# Patient Record
Sex: Male | Born: 1935 | Race: White | Hispanic: No | Marital: Married | Smoking: Former smoker
Health system: Southern US, Community
[De-identification: ages and names within clinical notes are randomized; demographics above are authoritative.]

## PROBLEM LIST (undated history)

## (undated) DIAGNOSIS — J45990 Exercise induced bronchospasm: Secondary | ICD-10-CM

## (undated) DIAGNOSIS — Z972 Presence of dental prosthetic device (complete) (partial): Secondary | ICD-10-CM

## (undated) DIAGNOSIS — H9193 Unspecified hearing loss, bilateral: Secondary | ICD-10-CM

## (undated) DIAGNOSIS — J45909 Unspecified asthma, uncomplicated: Secondary | ICD-10-CM

## (undated) DIAGNOSIS — N400 Enlarged prostate without lower urinary tract symptoms: Secondary | ICD-10-CM

## (undated) DIAGNOSIS — C801 Malignant (primary) neoplasm, unspecified: Secondary | ICD-10-CM

## (undated) DIAGNOSIS — Z85828 Personal history of other malignant neoplasm of skin: Secondary | ICD-10-CM

## (undated) DIAGNOSIS — K219 Gastro-esophageal reflux disease without esophagitis: Secondary | ICD-10-CM

## (undated) DIAGNOSIS — M1990 Unspecified osteoarthritis, unspecified site: Secondary | ICD-10-CM

## (undated) DIAGNOSIS — Z8669 Personal history of other diseases of the nervous system and sense organs: Secondary | ICD-10-CM

## (undated) HISTORY — PX: JOINT REPLACEMENT: SHX530

## (undated) HISTORY — PX: EYE SURGERY: SHX253

## (undated) HISTORY — PX: TONSILLECTOMY: SUR1361

## (undated) HISTORY — PX: CATARACT EXTRACTION W/ INTRAOCULAR LENS  IMPLANT, BILATERAL: SHX1307

---

## 1998-07-18 ENCOUNTER — Ambulatory Visit (HOSPITAL_COMMUNITY): Admission: RE | Admit: 1998-07-18 | Discharge: 1998-07-18 | Payer: Self-pay | Admitting: Family Medicine

## 2000-11-21 ENCOUNTER — Encounter: Payer: Self-pay | Admitting: Family Medicine

## 2000-11-21 ENCOUNTER — Ambulatory Visit (HOSPITAL_COMMUNITY): Admission: RE | Admit: 2000-11-21 | Discharge: 2000-11-21 | Payer: Self-pay | Admitting: Family Medicine

## 2000-11-28 ENCOUNTER — Encounter: Payer: Self-pay | Admitting: Family Medicine

## 2000-11-28 ENCOUNTER — Encounter: Admission: RE | Admit: 2000-11-28 | Discharge: 2000-11-28 | Payer: Self-pay | Admitting: Family Medicine

## 2000-12-27 HISTORY — PX: TOTAL KNEE ARTHROPLASTY: SHX125

## 2001-02-02 ENCOUNTER — Encounter: Admission: RE | Admit: 2001-02-02 | Discharge: 2001-04-17 | Payer: Self-pay | Admitting: Orthopaedic Surgery

## 2001-03-22 ENCOUNTER — Encounter: Payer: Self-pay | Admitting: Family Medicine

## 2001-03-22 ENCOUNTER — Ambulatory Visit (HOSPITAL_COMMUNITY): Admission: RE | Admit: 2001-03-22 | Discharge: 2001-03-22 | Payer: Self-pay | Admitting: Family Medicine

## 2001-05-15 ENCOUNTER — Ambulatory Visit (HOSPITAL_COMMUNITY): Admission: RE | Admit: 2001-05-15 | Discharge: 2001-05-15 | Payer: Self-pay | Admitting: Family Medicine

## 2001-05-18 ENCOUNTER — Inpatient Hospital Stay (HOSPITAL_COMMUNITY): Admission: RE | Admit: 2001-05-18 | Discharge: 2001-05-22 | Payer: Self-pay | Admitting: Orthopaedic Surgery

## 2001-06-27 ENCOUNTER — Encounter: Admission: RE | Admit: 2001-06-27 | Discharge: 2001-07-27 | Payer: Self-pay | Admitting: Orthopaedic Surgery

## 2001-08-21 ENCOUNTER — Encounter: Admission: RE | Admit: 2001-08-21 | Discharge: 2001-11-19 | Payer: Self-pay | Admitting: Orthopaedic Surgery

## 2001-11-20 ENCOUNTER — Encounter: Admission: RE | Admit: 2001-11-20 | Discharge: 2002-02-18 | Payer: Self-pay | Admitting: Orthopaedic Surgery

## 2002-01-25 ENCOUNTER — Ambulatory Visit (HOSPITAL_COMMUNITY): Admission: RE | Admit: 2002-01-25 | Discharge: 2002-01-25 | Payer: Self-pay | Admitting: Gastroenterology

## 2002-02-19 ENCOUNTER — Encounter: Admission: RE | Admit: 2002-02-19 | Discharge: 2002-05-20 | Payer: Self-pay | Admitting: Orthopaedic Surgery

## 2002-05-21 ENCOUNTER — Encounter: Admission: RE | Admit: 2002-05-21 | Discharge: 2002-05-25 | Payer: Self-pay | Admitting: Orthopaedic Surgery

## 2002-12-27 HISTORY — PX: ROTATOR CUFF REPAIR: SHX139

## 2004-05-12 ENCOUNTER — Ambulatory Visit (HOSPITAL_COMMUNITY): Admission: RE | Admit: 2004-05-12 | Discharge: 2004-05-12 | Payer: Self-pay | Admitting: Gastroenterology

## 2005-09-02 ENCOUNTER — Encounter: Admission: RE | Admit: 2005-09-02 | Discharge: 2005-09-02 | Payer: Self-pay | Admitting: Orthopaedic Surgery

## 2005-09-04 ENCOUNTER — Encounter: Admission: RE | Admit: 2005-09-04 | Discharge: 2005-09-04 | Payer: Self-pay | Admitting: Orthopaedic Surgery

## 2005-10-19 ENCOUNTER — Encounter: Admission: RE | Admit: 2005-10-19 | Discharge: 2005-10-19 | Payer: Self-pay | Admitting: Orthopaedic Surgery

## 2005-10-21 ENCOUNTER — Ambulatory Visit (HOSPITAL_BASED_OUTPATIENT_CLINIC_OR_DEPARTMENT_OTHER): Admission: RE | Admit: 2005-10-21 | Discharge: 2005-10-22 | Payer: Self-pay | Admitting: Orthopaedic Surgery

## 2005-10-21 ENCOUNTER — Ambulatory Visit (HOSPITAL_COMMUNITY): Admission: RE | Admit: 2005-10-21 | Discharge: 2005-10-21 | Payer: Self-pay | Admitting: Orthopaedic Surgery

## 2005-11-02 ENCOUNTER — Encounter: Admission: RE | Admit: 2005-11-02 | Discharge: 2005-11-30 | Payer: Self-pay | Admitting: Orthopaedic Surgery

## 2005-11-13 IMAGING — CR DG CHEST 2V
2 series · 2 of 2 positions shown · non-contrast
Comparison: none

CLINICAL DATA: Preop respiratory exam for impingement and rotator cuff tear.
 CHEST X-RAY:
 Two views of the chest show the lungs to be clear.  Mild peribronchial thickening is noted.  The heart is within upper limits of normal.  Old left rib fracture is present.

[w chest pa]
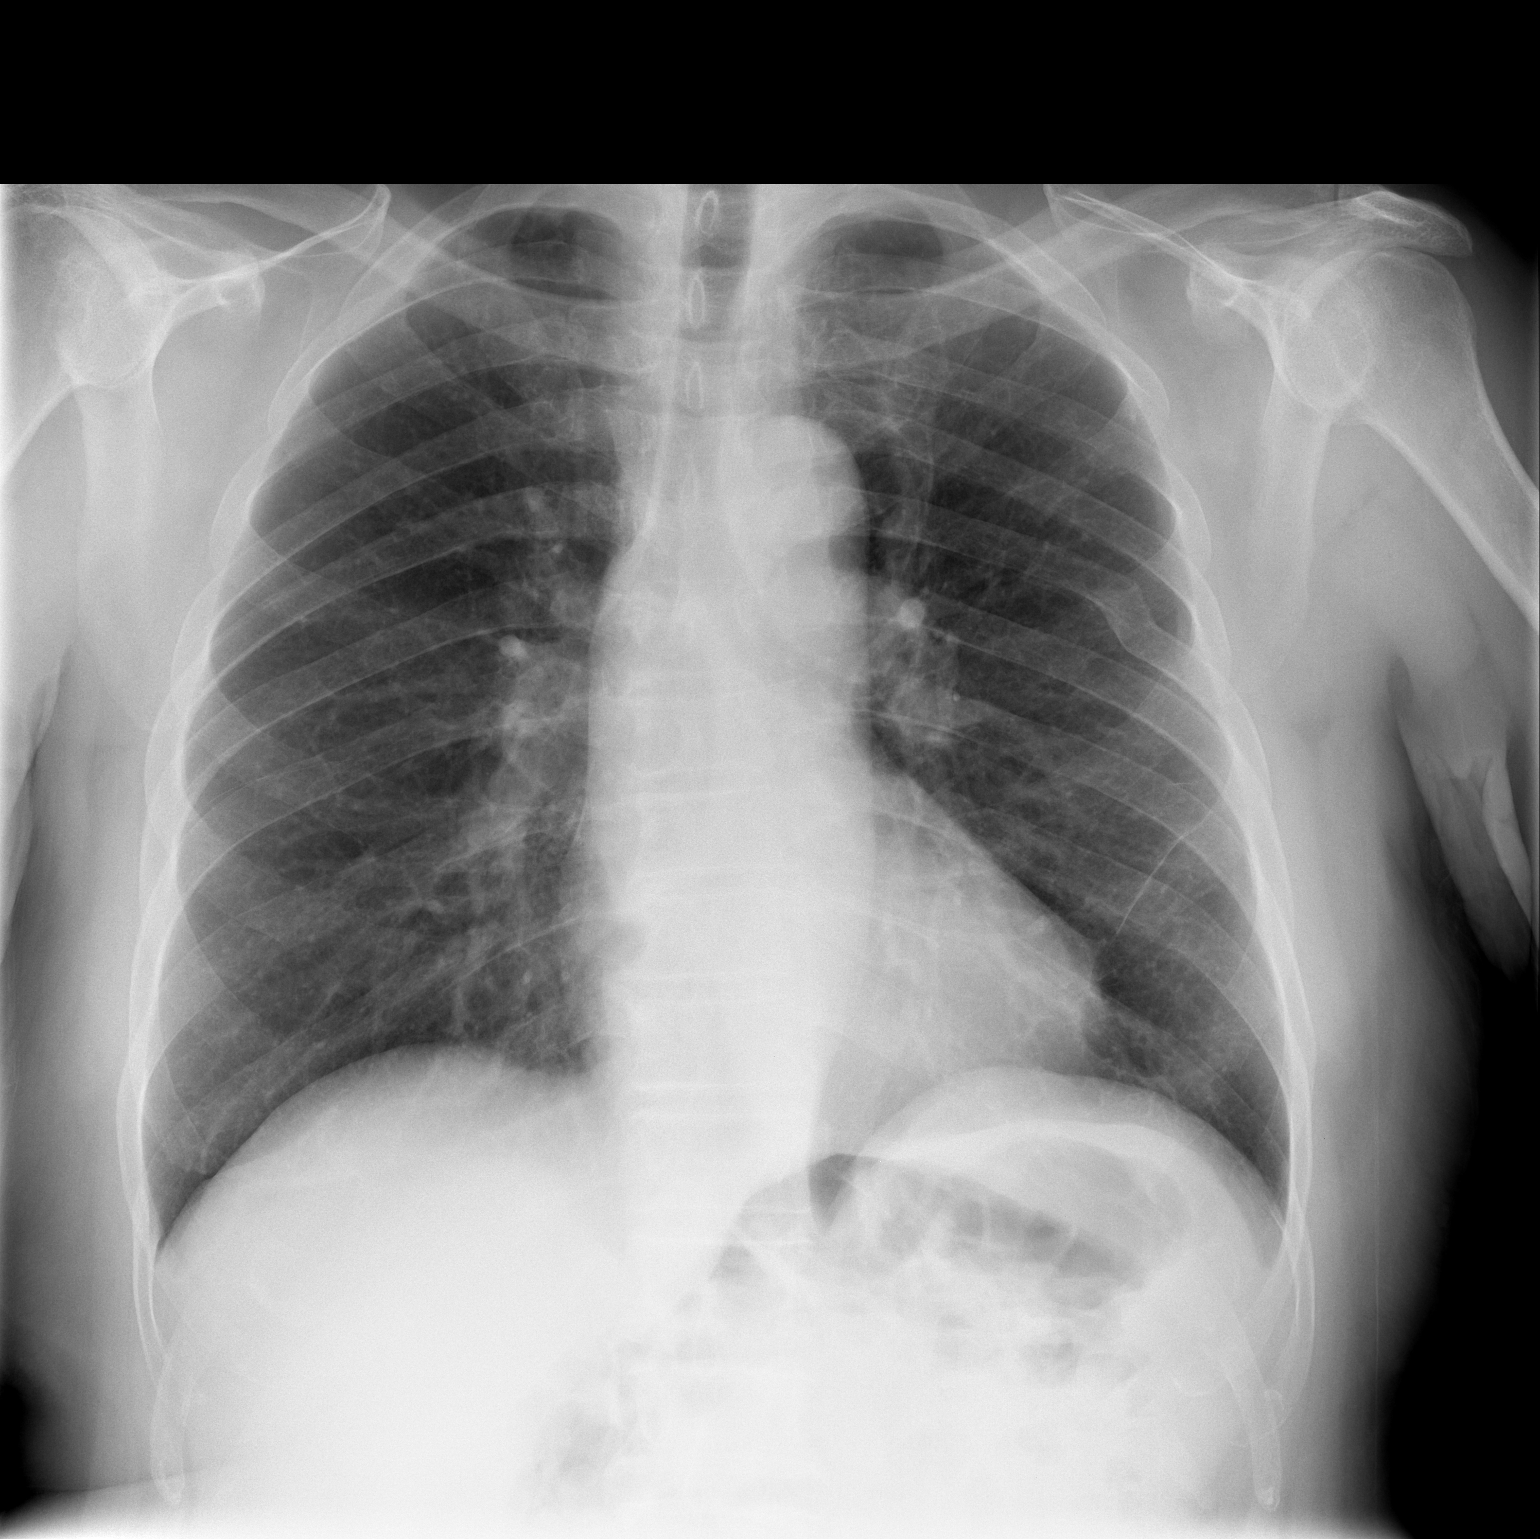

[w chest lat]
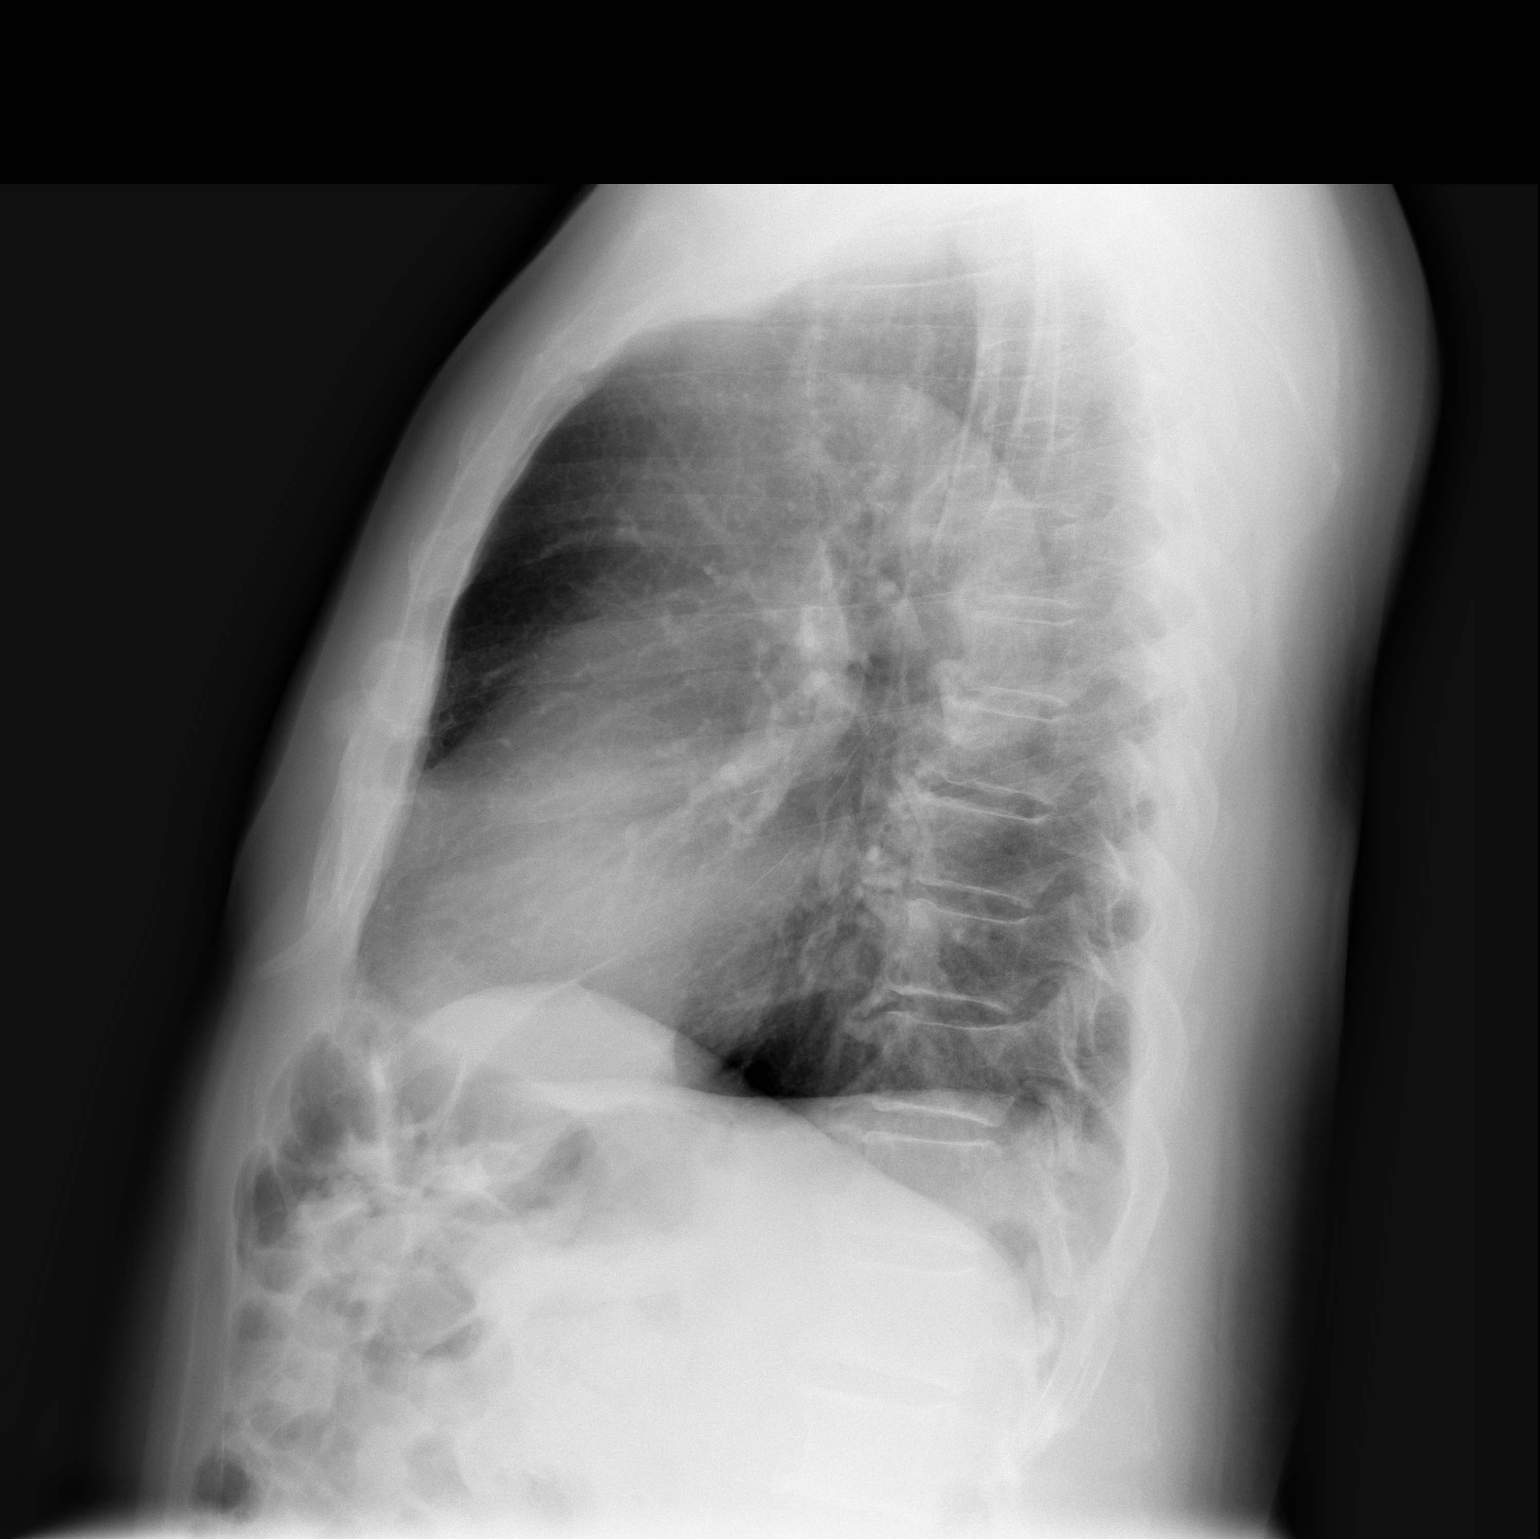

[2 of 2 positions shown; findings below may reference images not displayed]

IMPRESSION: No active lung disease.

## 2005-12-01 ENCOUNTER — Encounter: Admission: RE | Admit: 2005-12-01 | Discharge: 2005-12-30 | Payer: Self-pay | Admitting: Orthopaedic Surgery

## 2005-12-31 ENCOUNTER — Encounter: Admission: RE | Admit: 2005-12-31 | Discharge: 2006-03-31 | Payer: Self-pay | Admitting: Orthopaedic Surgery

## 2010-12-27 HISTORY — PX: COLONOSCOPY W/ POLYPECTOMY: SHX1380

## 2012-01-12 ENCOUNTER — Other Ambulatory Visit: Payer: Self-pay | Admitting: Dermatology

## 2012-01-12 DIAGNOSIS — D216 Benign neoplasm of connective and other soft tissue of trunk, unspecified: Secondary | ICD-10-CM | POA: Diagnosis not present

## 2012-01-12 DIAGNOSIS — L821 Other seborrheic keratosis: Secondary | ICD-10-CM | POA: Diagnosis not present

## 2012-01-12 DIAGNOSIS — D235 Other benign neoplasm of skin of trunk: Secondary | ICD-10-CM | POA: Diagnosis not present

## 2012-01-12 DIAGNOSIS — Z85828 Personal history of other malignant neoplasm of skin: Secondary | ICD-10-CM | POA: Diagnosis not present

## 2012-02-28 DIAGNOSIS — M216X9 Other acquired deformities of unspecified foot: Secondary | ICD-10-CM | POA: Diagnosis not present

## 2012-03-17 DIAGNOSIS — L600 Ingrowing nail: Secondary | ICD-10-CM | POA: Diagnosis not present

## 2012-06-07 DIAGNOSIS — H532 Diplopia: Secondary | ICD-10-CM | POA: Diagnosis not present

## 2012-06-07 DIAGNOSIS — Z961 Presence of intraocular lens: Secondary | ICD-10-CM | POA: Diagnosis not present

## 2012-06-07 DIAGNOSIS — H5000 Unspecified esotropia: Secondary | ICD-10-CM | POA: Diagnosis not present

## 2012-06-07 DIAGNOSIS — H52209 Unspecified astigmatism, unspecified eye: Secondary | ICD-10-CM | POA: Diagnosis not present

## 2012-09-15 DIAGNOSIS — Z23 Encounter for immunization: Secondary | ICD-10-CM | POA: Diagnosis not present

## 2013-01-11 DIAGNOSIS — D1739 Benign lipomatous neoplasm of skin and subcutaneous tissue of other sites: Secondary | ICD-10-CM | POA: Diagnosis not present

## 2013-01-11 DIAGNOSIS — L821 Other seborrheic keratosis: Secondary | ICD-10-CM | POA: Diagnosis not present

## 2013-01-11 DIAGNOSIS — L219 Seborrheic dermatitis, unspecified: Secondary | ICD-10-CM | POA: Diagnosis not present

## 2013-01-11 DIAGNOSIS — L570 Actinic keratosis: Secondary | ICD-10-CM | POA: Diagnosis not present

## 2013-04-11 DIAGNOSIS — R5381 Other malaise: Secondary | ICD-10-CM | POA: Diagnosis not present

## 2013-04-11 DIAGNOSIS — M1990 Unspecified osteoarthritis, unspecified site: Secondary | ICD-10-CM | POA: Diagnosis not present

## 2013-04-11 DIAGNOSIS — K219 Gastro-esophageal reflux disease without esophagitis: Secondary | ICD-10-CM | POA: Diagnosis not present

## 2013-04-11 DIAGNOSIS — N400 Enlarged prostate without lower urinary tract symptoms: Secondary | ICD-10-CM | POA: Diagnosis not present

## 2013-04-27 DIAGNOSIS — N3941 Urge incontinence: Secondary | ICD-10-CM | POA: Diagnosis not present

## 2013-04-27 DIAGNOSIS — N401 Enlarged prostate with lower urinary tract symptoms: Secondary | ICD-10-CM | POA: Diagnosis not present

## 2013-06-11 DIAGNOSIS — H524 Presbyopia: Secondary | ICD-10-CM | POA: Diagnosis not present

## 2013-06-11 DIAGNOSIS — H52209 Unspecified astigmatism, unspecified eye: Secondary | ICD-10-CM | POA: Diagnosis not present

## 2013-06-11 DIAGNOSIS — H532 Diplopia: Secondary | ICD-10-CM | POA: Diagnosis not present

## 2013-06-11 DIAGNOSIS — Z961 Presence of intraocular lens: Secondary | ICD-10-CM | POA: Diagnosis not present

## 2013-11-01 DIAGNOSIS — Z23 Encounter for immunization: Secondary | ICD-10-CM | POA: Diagnosis not present

## 2013-11-27 DIAGNOSIS — K219 Gastro-esophageal reflux disease without esophagitis: Secondary | ICD-10-CM | POA: Diagnosis not present

## 2013-11-27 DIAGNOSIS — R141 Gas pain: Secondary | ICD-10-CM | POA: Diagnosis not present

## 2014-01-10 DIAGNOSIS — M169 Osteoarthritis of hip, unspecified: Secondary | ICD-10-CM | POA: Diagnosis not present

## 2014-01-10 DIAGNOSIS — Z85828 Personal history of other malignant neoplasm of skin: Secondary | ICD-10-CM | POA: Diagnosis not present

## 2014-01-10 DIAGNOSIS — L821 Other seborrheic keratosis: Secondary | ICD-10-CM | POA: Diagnosis not present

## 2014-01-10 DIAGNOSIS — M1710 Unilateral primary osteoarthritis, unspecified knee: Secondary | ICD-10-CM | POA: Diagnosis not present

## 2014-01-10 DIAGNOSIS — M1610 Unilateral primary osteoarthritis, unspecified hip: Secondary | ICD-10-CM | POA: Diagnosis not present

## 2014-01-10 DIAGNOSIS — L280 Lichen simplex chronicus: Secondary | ICD-10-CM | POA: Diagnosis not present

## 2014-01-16 DIAGNOSIS — M1610 Unilateral primary osteoarthritis, unspecified hip: Secondary | ICD-10-CM | POA: Diagnosis not present

## 2014-01-16 DIAGNOSIS — M25559 Pain in unspecified hip: Secondary | ICD-10-CM | POA: Diagnosis not present

## 2014-04-08 DIAGNOSIS — H6500 Acute serous otitis media, unspecified ear: Secondary | ICD-10-CM | POA: Diagnosis not present

## 2014-04-08 DIAGNOSIS — H908 Mixed conductive and sensorineural hearing loss, unspecified: Secondary | ICD-10-CM | POA: Diagnosis not present

## 2014-04-08 DIAGNOSIS — H9190 Unspecified hearing loss, unspecified ear: Secondary | ICD-10-CM | POA: Diagnosis not present

## 2014-04-08 DIAGNOSIS — H6120 Impacted cerumen, unspecified ear: Secondary | ICD-10-CM | POA: Diagnosis not present

## 2014-04-29 DIAGNOSIS — J309 Allergic rhinitis, unspecified: Secondary | ICD-10-CM | POA: Diagnosis not present

## 2014-09-09 DIAGNOSIS — Z86010 Personal history of colonic polyps: Secondary | ICD-10-CM | POA: Diagnosis not present

## 2014-09-09 DIAGNOSIS — R198 Other specified symptoms and signs involving the digestive system and abdomen: Secondary | ICD-10-CM | POA: Diagnosis not present

## 2014-10-07 DIAGNOSIS — M25552 Pain in left hip: Secondary | ICD-10-CM | POA: Diagnosis not present

## 2014-10-07 DIAGNOSIS — M1612 Unilateral primary osteoarthritis, left hip: Secondary | ICD-10-CM | POA: Diagnosis not present

## 2014-10-07 DIAGNOSIS — M25511 Pain in right shoulder: Secondary | ICD-10-CM | POA: Diagnosis not present

## 2014-10-07 DIAGNOSIS — M75121 Complete rotator cuff tear or rupture of right shoulder, not specified as traumatic: Secondary | ICD-10-CM | POA: Diagnosis not present

## 2014-10-09 DIAGNOSIS — L821 Other seborrheic keratosis: Secondary | ICD-10-CM | POA: Diagnosis not present

## 2014-10-09 DIAGNOSIS — Z85828 Personal history of other malignant neoplasm of skin: Secondary | ICD-10-CM | POA: Diagnosis not present

## 2014-10-09 DIAGNOSIS — M25552 Pain in left hip: Secondary | ICD-10-CM | POA: Diagnosis not present

## 2014-10-09 DIAGNOSIS — M169 Osteoarthritis of hip, unspecified: Secondary | ICD-10-CM | POA: Diagnosis not present

## 2014-10-16 DIAGNOSIS — Z86010 Personal history of colonic polyps: Secondary | ICD-10-CM | POA: Diagnosis not present

## 2014-11-07 DIAGNOSIS — M1612 Unilateral primary osteoarthritis, left hip: Secondary | ICD-10-CM | POA: Diagnosis not present

## 2014-11-07 DIAGNOSIS — M25511 Pain in right shoulder: Secondary | ICD-10-CM | POA: Diagnosis not present

## 2014-11-07 DIAGNOSIS — M25552 Pain in left hip: Secondary | ICD-10-CM | POA: Diagnosis not present

## 2014-11-07 DIAGNOSIS — M7551 Bursitis of right shoulder: Secondary | ICD-10-CM | POA: Diagnosis not present

## 2014-11-25 ENCOUNTER — Ambulatory Visit: Payer: Medicare Other | Attending: Orthopaedic Surgery | Admitting: Physical Therapy

## 2014-11-25 DIAGNOSIS — Z5189 Encounter for other specified aftercare: Secondary | ICD-10-CM | POA: Diagnosis not present

## 2014-11-25 DIAGNOSIS — Z96651 Presence of right artificial knee joint: Secondary | ICD-10-CM | POA: Insufficient documentation

## 2014-11-25 DIAGNOSIS — Z01818 Encounter for other preprocedural examination: Secondary | ICD-10-CM | POA: Insufficient documentation

## 2014-11-25 DIAGNOSIS — R531 Weakness: Secondary | ICD-10-CM | POA: Diagnosis not present

## 2014-11-25 DIAGNOSIS — M1612 Unilateral primary osteoarthritis, left hip: Secondary | ICD-10-CM | POA: Insufficient documentation

## 2014-11-25 DIAGNOSIS — R29898 Other symptoms and signs involving the musculoskeletal system: Secondary | ICD-10-CM
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
## 2014-11-25 NOTE — Patient Instructions (Signed)
Hip Flexion / Knee Extension: Straight-Leg Raise (Eccentric)   Lie on back. Lift leg with knee straight. Slowly lower leg for 3-5 seconds. _10__ reps per set, _1-2__ sets per day.   ABDUCTION: Side-Lying (Active)   Lie on left side, top leg straight. Raise top leg as far as possible.  Complete _1__ sets of _10__ repetitions. Perform _1-2_ sessions per day.  http://gtsc.exer.us/94   (Home) Extension: Hip   With support under abdomen, tighten stomach. Lift right leg in line with body. Do not hyperextend. Alternate legs. Repeat _10___ times per set. Do _1___ sets per session. Do _1-2___ sessions per day. ADDUCTION: Side-Lying (Active)   Lie on right side, with top leg bent and in front of other leg. Lift straight leg up as high as possible.  Complete __1_ sets of _10__ repetitions. Perform _1-2__ sessions per day.  http://gtsc.exer.us/129   Copyright  VHI. All rights reserved.  Laureen Abrahams, PT, DPT 11/25/2014 10:39 AM [address] [phone] (office) [phone] (fax)

## 2014-12-02 ENCOUNTER — Ambulatory Visit: Payer: Medicare Other | Attending: Orthopaedic Surgery | Admitting: Physical Therapy

## 2014-12-02 DIAGNOSIS — R531 Weakness: Secondary | ICD-10-CM | POA: Insufficient documentation

## 2014-12-02 DIAGNOSIS — Z01818 Encounter for other preprocedural examination: Secondary | ICD-10-CM | POA: Diagnosis not present

## 2014-12-02 DIAGNOSIS — R29898 Other symptoms and signs involving the musculoskeletal system: Secondary | ICD-10-CM

## 2014-12-02 DIAGNOSIS — Z5189 Encounter for other specified aftercare: Secondary | ICD-10-CM | POA: Insufficient documentation

## 2014-12-02 DIAGNOSIS — M1612 Unilateral primary osteoarthritis, left hip: Secondary | ICD-10-CM | POA: Diagnosis not present

## 2014-12-02 DIAGNOSIS — Z96651 Presence of right artificial knee joint: Secondary | ICD-10-CM | POA: Insufficient documentation

## 2014-12-02 NOTE — Therapy (Signed)
Outpatient Rehabilitation [facility] [address] [place], [place], [address] Phone: [phone]   Fax:  [phone]  Physical Therapy Treatment  Patient Details  Name: Aaron Alvarez MRN: [id number] Date of Birth: 08/11/1925  Encounter Date: 12/02/2014      PT End of Session - 12/02/14 1455    Visit Number 2   Number of Visits 4   Date for PT Re-Evaluation 01/24/15   PT Start Time 3299   PT Stop Time 1455   PT Time Calculation (min) 40 min   Activity Tolerance Patient tolerated treatment well   Behavior During Therapy [facility] for tasks assessed/performed      No past medical history on file.  No past surgical history on file.  There were no vitals taken for this visit.  Visit Diagnosis:  Arthritis of left hip  Weakness of left hip      Subjective Assessment - 12/02/14 1419    Symptoms Pt reports L hip soreness; took a twisting step which increased pain/inflammation (last Thursday) and continues to have pain/   Pertinent History L TKA 2003   Limitations Walking;Sitting   How long can you walk comfortably? 1 mile   Patient Stated Goals improve strength in hip   Currently in Pain? Yes   Pain Score 4    Pain Location Hip   Pain Orientation Left   Pain Descriptors / Indicators Aching   Pain Type Chronic pain   Pain Onset More than a month ago   Pain Frequency Intermittent   Aggravating Factors  walking without cane   Pain Relieving Factors rest   Multiple Pain Sites No            [facility] Adult PT Treatment/Exercise - 12/02/14 1420    Exercises   Exercises Knee/Hip   Knee/Hip Exercises: Aerobic   Stationary Bike NuStep x 8 min, Level 3, 4 extremities   Knee/Hip Exercises: Standing   Gait Training marching forwards/backwards with 1UE support   Knee/Hip Exercises: Supine   Bridges 10 reps;Both   Straight Leg Raises Strengthening;Both;10 reps   Knee/Hip Exercises: Sidelying   Hip ABduction Strengthening;Both;10 reps   Hip ADduction  Strengthening;Both;10 reps   Clams x 10 reps bil with red theraband-mod cues for technique   Knee/Hip Exercises: Prone   Hip Extension Strengthening;Both;10 reps          PT Education - 12/02/14 1454    Education provided Yes   Education Details HEP reviewed   Person(s) Educated Patient   Methods Explanation;Demonstration;Tactile cues;Verbal cues   Comprehension Verbalized understanding;Returned demonstration            PT Long Term Goals - 12/02/14 1455    PT LONG TERM GOAL #1   Title independent with advanced HEP to improve strength prior to THA. (12/23/14)   Time 4   Period Weeks   Status On-going   PT LONG TERM GOAL #2   Title report pain < 3/10 with activity (12/23/14)   Time 4   Period Weeks   Status On-going          Plan - 12/02/14 1455    Clinical Impression Statement Pt reports continued L hip pain but compliance with HEP and feels exercises are helping.   PT Next Visit Plan update HEP to standing exercises, clams and bridging   Consulted and Agree with Plan of Care Patient  Problem List There are no active problems to display for this patient.  Laureen Abrahams, PT, DPT 12/02/2014 2:56 PM  [facility] [address], [place] [address]  [phone] (office) [phone] (fax)

## 2014-12-09 ENCOUNTER — Ambulatory Visit: Payer: Medicare Other | Admitting: Physical Therapy

## 2014-12-09 DIAGNOSIS — M1612 Unilateral primary osteoarthritis, left hip: Secondary | ICD-10-CM | POA: Diagnosis not present

## 2014-12-09 DIAGNOSIS — R29898 Other symptoms and signs involving the musculoskeletal system: Secondary | ICD-10-CM

## 2014-12-09 DIAGNOSIS — H8111 Benign paroxysmal vertigo, right ear: Secondary | ICD-10-CM

## 2014-12-09 NOTE — Patient Instructions (Signed)
Standing Marching   Using a chair if necessary, march in place. Repeat __10__ times. Do _1-2___ sessions per day.  http://gt2.exer.us/344   Copyright  VHI. All rights reserved.   Hip Backward Kick   Using a chair for balance, keep legs shoulder width apart and toes pointed for- ward. Slowly extend one leg back, keeping knee straight. Do not lean forward. Repeat with other leg. Repeat _10___ times. Do _1-2___ sessions per day.  http://gt2.exer.us/340   Copyright  VHI. All rights reserved.   Hip Side Kick   Holding a chair for balance, keep legs shoulder width apart and toes pointed forward. Swing a leg out to side, keeping knee straight. Do not lean. Repeat using other leg. Repeat _10___ times. Do _1-2___ sessions per day.  http://gt2.exer.us/342   Copyright  VHI. All rights reserved.    Heel Raise: Bilateral (Standing)   Rise on balls of feet. Repeat __20__ times per set. Do _1___ sets per session. Do __1-2__ sessions per day.  http://orth.exer.us/38   Copyright  VHI. All rights reserved.   Laureen Abrahams, PT, DPT 12/09/2014 10:51 AM  [facility] [address]  [address]  [phone] (office) [phone] (fax)

## 2014-12-09 NOTE — Therapy (Signed)
Outpatient Rehabilitation [facility] [address] [place], [place], [address] Phone: [phone]   Fax:  [phone]  Physical Therapy Treatment  Patient Details  Name: Aaron Alvarez MRN: [id number] Date of Birth: 25-Aug-1925  Encounter Date: 12/09/2014      PT End of Session - 12/09/14 1102    Visit Number 3   Number of Visits 4   Date for PT Re-Evaluation 01/24/15   PT Start Time 1015   PT Stop Time 1057   PT Time Calculation (min) 42 min   Activity Tolerance Patient tolerated treatment well   Behavior During Therapy [facility] for tasks assessed/performed      No past medical history on file.  No past surgical history on file.  There were no vitals taken for this visit.  Visit Diagnosis:  Arthritis of left hip - Plan: PT plan of care cert/re-cert  Weakness of left hip - Plan: PT plan of care cert/re-cert  BPPV (benign paroxysmal positional vertigo), right - Plan: PT plan of care cert/re-cert      Subjective Assessment - 12/09/14 1041    Symptoms Episodes of vertigo this weekend.  Hasn't been able to do exercises over weekend except 1x/day   Pertinent History L TKA 2003   Limitations Walking;Sitting   How long can you walk comfortably? 1 mile   Patient Stated Goals improve strength in hip   Currently in Pain? Yes   Pain Score 1    Pain Location Hip   Pain Orientation Left   Pain Descriptors / Indicators Aching   Pain Type Chronic pain   Pain Onset More than a month ago   Pain Frequency Intermittent   Aggravating Factors  walking without cane   Pain Relieving Factors rest   Multiple Pain Sites No            [facility] Adult PT Treatment/Exercise - 12/09/14 1044    Lumbar Exercises: Standing   Heel Raises 20 reps;2 seconds   Knee/Hip Exercises: Standing   Gait Training standing hip marching, abdct, ext x 10 reps bil          PT Education - 12/09/14 1101    Education provided Yes   Education Details standing HEP; BPPV   Person(s) Educated  Patient   Methods Explanation;Demonstration;Handout   Comprehension Verbalized understanding;Returned demonstration            PT Long Term Goals - 12/09/14 1105    PT LONG TERM GOAL #1   Title independent with advanced HEP to improve strength prior to THA. (12/23/14)   Time 4   Period Weeks   Status On-going   PT LONG TERM GOAL #2   Title report pain < 3/10 with activity (12/23/14)   Time 4   Period Weeks   Status On-going          Plan - 12/09/14 1102    Clinical Impression Statement Pt presents today with 3 day hx of vertigo.  R sidelying test revealed upbeating rotary nystagmus ~10 sec duration.  Treated with R epley's x 2 reps with resolve of symptoms.  Pt unable to perform HEP consistently due to vertigo.  Pt has MD appt tomorrow to follow up with vertigo.   Pt will benefit from skilled therapeutic intervention in order to improve on the following deficits Abnormal gait;Decreased activity tolerance;Decreased balance;Decreased mobility;Decreased strength;Pain;Decreased range of motion;Difficulty walking;Impaired flexibility;Other (comment)  vertigo   Rehab Potential Good   PT Frequency 1x / week   PT Duration 4 weeks  PT Treatment/Interventions ADLs/Self Care Home Management;Therapeutic activities;Patient/family education;Therapeutic exercise;Passive range of motion;Gait training;Neuromuscular re-education  canalith repositioning   PT Next Visit Plan review standing HEP, add additional exercises as needed; plan to begin checking goals   Consulted and Agree with Plan of Care Patient               Vestibular Assessment - 12/09/14 1043    Sidelying Test Sidelying Right;Sidelying Left   Sidelying Right Duration 10 sec   Sidelying Right Symptoms Upbeat, right rotatory nystagmus   Sidelying Left Duration negative   Sidelying Left Symptoms No nystagmus         Vestibular Treatment/Exercise - 12/09/14 1042    Vestibular Treatment Provided Canalith Repositioning    Canalith Repositioning Epley Manuever Right   Number of Reps  2   Overall Response Symptoms Resolved                       Problem List There are no active problems to display for this patient.  Laureen Abrahams, PT, DPT 12/09/2014 11:12 AM  [facility] [address], [place] [address]  [phone] (office) [phone] (fax)

## 2014-12-16 ENCOUNTER — Ambulatory Visit: Payer: Medicare Other | Admitting: Physical Therapy

## 2014-12-16 DIAGNOSIS — M1612 Unilateral primary osteoarthritis, left hip: Secondary | ICD-10-CM

## 2014-12-16 DIAGNOSIS — R29898 Other symptoms and signs involving the musculoskeletal system: Secondary | ICD-10-CM

## 2014-12-16 NOTE — Patient Instructions (Signed)
Wrap band around ankles.  At countertop you can sidestep; walk forwards; or walk backwards.  Hold on for support.  Perform 1-3 times a day; 2-3 reps per session.  Laureen Abrahams, PT, DPT 12/16/2014 10:42 AM   Outpatient Rehab [address], [place] [address]  [phone] (office) [phone] (fax)

## 2014-12-16 NOTE — Therapy (Signed)
[place], [place], [address] Phone: [phone]   Fax:  [phone]  Physical Therapy Treatment  Patient Details  Name: Aaron Alvarez MRN: [id number] Date of Birth: 1925-12-16  Encounter Date: Jan 09, 2015      PT End of Session - 01-09-15 1115    Visit Number 4   Number of Visits 4   Date for PT Re-Evaluation 01/24/15   PT Start Time 4270   PT Stop Time 1045   PT Time Calculation (min) 30 min   Activity Tolerance Patient tolerated treatment well   Behavior During Therapy [facility] for tasks assessed/performed      No past medical history on file.  No past surgical history on file.  There were no vitals taken for this visit.  Visit Diagnosis:  Arthritis of left hip  Weakness of left hip      Subjective Assessment - Jan 09, 2015 1019    Symptoms Vertigo gone.  Compliant with HEP; had a couple episodes of sharp pain in hip.   Pertinent History L TKA 2003   Limitations Walking;Sitting   How long can you walk comfortably? 1 mile   Patient Stated Goals improve strength in hip   Currently in Pain? Yes   Pain Score 3    Pain Location Hip   Pain Orientation Left   Pain Descriptors / Indicators Aching   Pain Type Chronic pain   Pain Onset More than a month ago   Pain Frequency Intermittent   Aggravating Factors  walking without cane   Pain Relieving Factors rest                    [facility] Adult PT Treatment/Exercise - 01/09/15 1113    Knee/Hip Exercises: Standing   Heel Raises 20 reps   Walking with Sports Cord forwards/backwards/laterally with red theraband   Gait Training standing hip marching, abdct, ext x 10 reps bil                PT Education - 09-Jan-2015 1114    Education provided Yes   Education Details updated HEP; long discussion with pt re: goals of care and POC and pt feels he has enough exercises and will be compliant at home to continue prior to THA.   Person(s)  Educated Patient   Methods Explanation;Demonstration;Handout   Comprehension Verbalized understanding             PT Long Term Goals - 01-09-2015 1042    PT LONG TERM GOAL #1   Title independent with advanced HEP to improve strength prior to THA. (12/23/14)   Time 4   Period Weeks   Status Achieved   PT LONG TERM GOAL #2   Title report pain < 3/10 with activity (12/23/14)   Time 4   Period Weeks   Status Achieved               Plan - 01/09/15 1115    Clinical Impression Statement Pt with many exercises to perform at home and ready for d/c.  All goals met.   PT Next Visit Plan d/c PT   Consulted and Agree with Plan of Care Patient          G-Codes - 01/09/2015 1116    Functional Assessment Tool Used bil hip weakness with HEP to address weakness   Functional Limitation Self care   Self Care Goal Status (W2376) At least 1 percent but less than 20 percent impaired, limited  or restricted   Self Care Discharge Status [phone]) At least 1 percent but less than 20 percent impaired, limited or restricted      Problem List There are no active problems to display for this patient.   Laureen Abrahams, PT, DPT 12/16/2014 11:18 AM  [facility] [address] [place], [place], [address] Phone: [phone]   Fax:  [phone]   PHYSICAL THERAPY DISCHARGE SUMMARY  Visits from Start of Care: 4  Current functional level related to goals / functional outcomes: See above; all goals met.   Remaining deficits: L hip weakness and occasional pain associated with end stage arthritis.  Pt reports hoping for THA in Jan/Feb 2016.   Education / Equipment: HEP, BPPV  Plan: Patient agrees to discharge.  Patient goals were met. Patient is being discharged due to meeting the stated rehab goals.  ?????       Laureen Abrahams, PT, DPT 12/16/2014 11:19 AM  [facility] [address]  [place] [address]  [phone] (office) [phone] (fax)

## 2014-12-24 DIAGNOSIS — M1612 Unilateral primary osteoarthritis, left hip: Secondary | ICD-10-CM | POA: Diagnosis not present

## 2015-01-02 DIAGNOSIS — K219 Gastro-esophageal reflux disease without esophagitis: Secondary | ICD-10-CM | POA: Diagnosis not present

## 2015-01-02 DIAGNOSIS — M1990 Unspecified osteoarthritis, unspecified site: Secondary | ICD-10-CM | POA: Diagnosis not present

## 2015-01-02 DIAGNOSIS — R030 Elevated blood-pressure reading, without diagnosis of hypertension: Secondary | ICD-10-CM | POA: Diagnosis not present

## 2015-01-02 DIAGNOSIS — N400 Enlarged prostate without lower urinary tract symptoms: Secondary | ICD-10-CM | POA: Diagnosis not present

## 2015-01-24 NOTE — Pre-Procedure Instructions (Signed)
Aaron Alvarez  01/24/2015   Your procedure is scheduled on:  Tuesday, February 9th  Report to [facility] Admitting at 8 AM.  Call this number if you have problems the morning of surgery: [phone]   Remember:   Do not eat food or drink liquids after midnight.   Take these medicines the morning of surgery with A SIP OF WATER: flomax   Do not wear jewelry  Do not wear lotions, powders, or perfume,deodorant.  Do not shave 48 hours prior to surgery. Men may shave face and neck.  Do not bring valuables to the hospital.  [facility] is not responsible  for any belongings or valuables.               Contacts, dentures or bridgework may not be worn into surgery.  Leave suitcase in the car. After surgery it may be brought to your room.  For patients admitted to the hospital, discharge time is determined by your  treatment team.          Please read over the following fact sheets that you were given: Pain Booklet, Coughing and Deep Breathing, Blood Transfusion Information, MRSA Information and Surgical Site Infection Prevention  [place] - Preparing for Surgery  Before surgery, you can play an important role.  Because skin is not sterile, your skin needs to be as free of germs as possible.  You can reduce the number of germs on you skin by washing with CHG (chlorahexidine gluconate) soap before surgery.  CHG is an antiseptic cleaner which kills germs and bonds with the skin to continue killing germs even after washing.  Please DO NOT use if you have an allergy to CHG or antibacterial soaps.  If your skin becomes reddened/irritated stop using the CHG and inform your nurse when you arrive at Short Stay.  Do not shave (including legs and underarms) for at least 48 hours prior to the first CHG shower.  You may shave your face.  Please follow these instructions carefully:   1.  Shower with CHG Soap the night before surgery and the morning of Surgery.  2.  If you choose to wash  your hair, wash your hair first as usual with your normal shampoo.  3.  After you shampoo, rinse your hair and body thoroughly to remove the shampoo.  4.  Use CHG as you would any other liquid soap.  You can apply CHG directly to the skin and wash gently with scrungie or a clean washcloth.  5.  Apply the CHG Soap to your body ONLY FROM THE NECK DOWN.  Do not use on open wounds or open sores.  Avoid contact with your eyes, ears, mouth and genitals (private parts).  Wash genitals (private parts) with your normal soap.  6.  Wash thoroughly, paying special attention to the area where your surgery will be performed.  7.  Thoroughly rinse your body with warm water from the neck down.  8.  DO NOT shower/wash with your normal soap after using and rinsing off the CHG Soap.  9.  Pat yourself dry with a clean towel.            10.  Wear clean pajamas.            11.  Place clean sheets on your bed the night of your first shower and do not sleep with pets.  Day of Surgery  Do not apply any lotions/deoderants the morning of surgery.  Please wear clean clothes to the hospital/surgery center.

## 2015-01-27 ENCOUNTER — Encounter (HOSPITAL_COMMUNITY)
Admission: RE | Admit: 2015-01-27 | Discharge: 2015-01-27 | Disposition: A | Discharge status: Home / Self Care | Payer: Medicare Other | Source: Ambulatory Visit | Attending: Orthopaedic Surgery | Admitting: Orthopaedic Surgery

## 2015-01-27 ENCOUNTER — Encounter (HOSPITAL_COMMUNITY): Payer: Self-pay

## 2015-01-27 ENCOUNTER — Ambulatory Visit (HOSPITAL_COMMUNITY)
Admission: RE | Admit: 2015-01-27 | Discharge: 2015-01-27 | Disposition: A | Discharge status: Home / Self Care | Payer: Medicare Other | Source: Ambulatory Visit | Attending: Orthopedic Surgery | Admitting: Orthopedic Surgery

## 2015-01-27 DIAGNOSIS — Z01818 Encounter for other preprocedural examination: Secondary | ICD-10-CM

## 2015-01-27 DIAGNOSIS — I493 Ventricular premature depolarization: Secondary | ICD-10-CM | POA: Insufficient documentation

## 2015-01-27 DIAGNOSIS — Z0183 Encounter for blood typing: Secondary | ICD-10-CM | POA: Insufficient documentation

## 2015-01-27 DIAGNOSIS — M1612 Unilateral primary osteoarthritis, left hip: Secondary | ICD-10-CM | POA: Insufficient documentation

## 2015-01-27 DIAGNOSIS — F17200 Nicotine dependence, unspecified, uncomplicated: Secondary | ICD-10-CM | POA: Diagnosis not present

## 2015-01-27 DIAGNOSIS — Z01812 Encounter for preprocedural laboratory examination: Secondary | ICD-10-CM | POA: Diagnosis not present

## 2015-01-27 HISTORY — DX: Unspecified osteoarthritis, unspecified site: M19.90

## 2015-01-27 HISTORY — DX: Malignant (primary) neoplasm, unspecified: C80.1

## 2015-01-27 HISTORY — DX: Unspecified asthma, uncomplicated: J45.909

## 2015-01-27 HISTORY — DX: Gastro-esophageal reflux disease without esophagitis: K21.9

## 2015-01-27 HISTORY — DX: Benign prostatic hyperplasia without lower urinary tract symptoms: N40.0

## 2015-01-27 HISTORY — DX: Presence of dental prosthetic device (complete) (partial): Z97.2

## 2015-01-27 HISTORY — DX: Exercise induced bronchospasm: J45.990

## 2015-01-27 HISTORY — DX: Personal history of other malignant neoplasm of skin: Z85.828

## 2015-01-27 HISTORY — DX: Unspecified hearing loss, bilateral: H91.93

## 2015-01-27 HISTORY — DX: Personal history of other diseases of the nervous system and sense organs: Z86.69

## 2015-01-27 LAB — CBC WITH DIFFERENTIAL/PLATELET
BASOS ABS: 0 10*3/uL (ref 0.0–0.1)
Basophils Relative: 0 % (ref 0–1)
Eosinophils Absolute: 0.1 10*3/uL (ref 0.0–0.7)
Eosinophils Relative: 1 % (ref 0–5)
HEMATOCRIT: 41 % (ref 39.0–52.0)
Hemoglobin: 14 g/dL (ref 13.0–17.0)
LYMPHS PCT: 28 % (ref 12–46)
Lymphs Abs: 1.6 10*3/uL (ref 0.7–4.0)
MCH: 29.2 pg (ref 26.0–34.0)
MCHC: 34.1 g/dL (ref 30.0–36.0)
MCV: 85.6 fL (ref 78.0–100.0)
Monocytes Absolute: 0.3 10*3/uL (ref 0.1–1.0)
Monocytes Relative: 5 % (ref 3–12)
NEUTROS PCT: 66 % (ref 43–77)
Neutro Abs: 3.8 10*3/uL (ref 1.7–7.7)
PLATELETS: 223 10*3/uL (ref 150–400)
RBC: 4.79 MIL/uL (ref 4.22–5.81)
RDW: 12.4 % (ref 11.5–15.5)
WBC: 5.7 10*3/uL (ref 4.0–10.5)

## 2015-01-27 LAB — COMPREHENSIVE METABOLIC PANEL
ALT: 16 U/L (ref 0–53)
AST: 22 U/L (ref 0–37)
Albumin: 4 g/dL (ref 3.5–5.2)
Alkaline Phosphatase: 98 U/L (ref 39–117)
Anion gap: 6 (ref 5–15)
BUN: 13 mg/dL (ref 6–23)
CO2: 29 mmol/L (ref 19–32)
Calcium: 10 mg/dL (ref 8.4–10.5)
Chloride: 104 mmol/L (ref 96–112)
Creatinine, Ser: 0.73 mg/dL (ref 0.50–1.35)
GFR calc Af Amer: >90 mL/min (ref >90)
GFR calc non Af Amer: 80 mL/min — ABNORMAL LOW (ref >90)
Glucose, Bld: 104 mg/dL — ABNORMAL HIGH (ref 70–99)
Potassium: 4.7 mmol/L (ref 3.5–5.1)
Sodium: 139 mmol/L (ref 135–145)
Total Bilirubin: 0.5 mg/dL (ref 0.3–1.2)
Total Protein: 6.9 g/dL (ref 6.0–8.3)

## 2015-01-27 LAB — PROTIME-INR
INR: 1.02 (ref 0.00–1.49)
PROTHROMBIN TIME: 13.5 s (ref 11.6–15.2)

## 2015-01-27 LAB — URINALYSIS, ROUTINE W REFLEX MICROSCOPIC
BILIRUBIN URINE: NEGATIVE
Glucose, UA: NEGATIVE mg/dL
Hgb urine dipstick: NEGATIVE
KETONES UR: NEGATIVE mg/dL
NITRITE: NEGATIVE
PH: 7 (ref 5.0–8.0)
PROTEIN: NEGATIVE mg/dL
Specific Gravity, Urine: 1.013 (ref 1.005–1.030)
UROBILINOGEN UA: 0.2 mg/dL (ref 0.0–1.0)

## 2015-01-27 LAB — ABO/RH: ABO/RH(D): O POS

## 2015-01-27 LAB — APTT: aPTT: 31 seconds (ref 24–37)

## 2015-01-27 LAB — URINE MICROSCOPIC-ADD ON

## 2015-01-27 LAB — SURGICAL PCR SCREEN
MRSA, PCR: NEGATIVE
STAPHYLOCOCCUS AUREUS: POSITIVE — AB

## 2015-01-27 NOTE — Progress Notes (Signed)
I called a prescription for Mupirocin ointment to [facility], [place], [place], [place].

## 2015-01-29 DIAGNOSIS — M1612 Unilateral primary osteoarthritis, left hip: Secondary | ICD-10-CM | POA: Diagnosis not present
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
## 2015-02-03 MED ORDER — ACETAMINOPHEN 10 MG/ML IV SOLN
1000.0000 mg | Freq: Four times a day (QID) | INTRAVENOUS | Status: DC
  Administered 2015-02-04: 1000 mg via INTRAVENOUS

## 2015-02-03 MED ORDER — CEFAZOLIN SODIUM-DEXTROSE 2-3 GM-% IV SOLR
2.0000 g | INTRAVENOUS | Status: AC
  Administered 2015-02-04: 2 g via INTRAVENOUS
  Filled 2015-02-03: qty 50

## 2015-02-03 MED ORDER — SODIUM CHLORIDE 0.9 % IV SOLN: INTRAVENOUS | Status: DC

## 2015-02-03 MED ORDER — CHLORHEXIDINE GLUCONATE 4 % EX LIQD: 60.0000 mL | Freq: Once | CUTANEOUS | Status: DC

## 2015-02-04 ENCOUNTER — Inpatient Hospital Stay (HOSPITAL_COMMUNITY): Payer: Medicare Other | Admitting: Anesthesiology

## 2015-02-04 ENCOUNTER — Encounter (HOSPITAL_COMMUNITY)
Admission: RE | Disposition: A | Discharge status: Skilled Nursing Facility | Payer: Self-pay | Source: Ambulatory Visit | Attending: Orthopaedic Surgery

## 2015-02-04 ENCOUNTER — Inpatient Hospital Stay (HOSPITAL_COMMUNITY)
Admission: RE | Admit: 2015-02-04 | Discharge: 2015-02-07 | DRG: 470 | Disposition: A | Discharge status: Skilled Nursing Facility | Payer: Medicare Other | Source: Ambulatory Visit | Attending: Orthopaedic Surgery | Admitting: Orthopaedic Surgery

## 2015-02-04 ENCOUNTER — Inpatient Hospital Stay (HOSPITAL_COMMUNITY): Payer: Medicare Other

## 2015-02-04 ENCOUNTER — Encounter (HOSPITAL_COMMUNITY): Payer: Self-pay | Admitting: *Deleted

## 2015-02-04 DIAGNOSIS — Z811 Family history of alcohol abuse and dependence: Secondary | ICD-10-CM

## 2015-02-04 DIAGNOSIS — Z96649 Presence of unspecified artificial hip joint: Secondary | ICD-10-CM

## 2015-02-04 DIAGNOSIS — R350 Frequency of micturition: Secondary | ICD-10-CM | POA: Diagnosis present

## 2015-02-04 DIAGNOSIS — N400 Enlarged prostate without lower urinary tract symptoms: Secondary | ICD-10-CM | POA: Diagnosis not present

## 2015-02-04 DIAGNOSIS — E785 Hyperlipidemia, unspecified: Secondary | ICD-10-CM | POA: Diagnosis not present

## 2015-02-04 DIAGNOSIS — M76892 Other specified enthesopathies of left lower limb, excluding foot: Secondary | ICD-10-CM | POA: Diagnosis present

## 2015-02-04 DIAGNOSIS — Z96642 Presence of left artificial hip joint: Secondary | ICD-10-CM | POA: Diagnosis not present

## 2015-02-04 DIAGNOSIS — N401 Enlarged prostate with lower urinary tract symptoms: Secondary | ICD-10-CM | POA: Diagnosis present

## 2015-02-04 DIAGNOSIS — M1612 Unilateral primary osteoarthritis, left hip: Principal | ICD-10-CM | POA: Diagnosis present

## 2015-02-04 DIAGNOSIS — Z79899 Other long term (current) drug therapy: Secondary | ICD-10-CM

## 2015-02-04 DIAGNOSIS — R112 Nausea with vomiting, unspecified: Secondary | ICD-10-CM | POA: Diagnosis not present

## 2015-02-04 DIAGNOSIS — M1611 Unilateral primary osteoarthritis, right hip: Secondary | ICD-10-CM | POA: Diagnosis not present

## 2015-02-04 DIAGNOSIS — Z823 Family history of stroke: Secondary | ICD-10-CM | POA: Diagnosis not present

## 2015-02-04 DIAGNOSIS — Z7901 Long term (current) use of anticoagulants: Secondary | ICD-10-CM | POA: Diagnosis not present

## 2015-02-04 DIAGNOSIS — I959 Hypotension, unspecified: Secondary | ICD-10-CM | POA: Diagnosis not present

## 2015-02-04 DIAGNOSIS — M169 Osteoarthritis of hip, unspecified: Secondary | ICD-10-CM | POA: Diagnosis not present

## 2015-02-04 DIAGNOSIS — Z87891 Personal history of nicotine dependence: Secondary | ICD-10-CM | POA: Diagnosis not present

## 2015-02-04 DIAGNOSIS — M6281 Muscle weakness (generalized): Secondary | ICD-10-CM | POA: Diagnosis not present

## 2015-02-04 DIAGNOSIS — K219 Gastro-esophageal reflux disease without esophagitis: Secondary | ICD-10-CM | POA: Diagnosis present

## 2015-02-04 DIAGNOSIS — M25552 Pain in left hip: Secondary | ICD-10-CM | POA: Diagnosis not present

## 2015-02-04 DIAGNOSIS — R2681 Unsteadiness on feet: Secondary | ICD-10-CM | POA: Diagnosis not present

## 2015-02-04 DIAGNOSIS — D62 Acute posthemorrhagic anemia: Secondary | ICD-10-CM | POA: Diagnosis not present

## 2015-02-04 DIAGNOSIS — Z471 Aftercare following joint replacement surgery: Secondary | ICD-10-CM | POA: Diagnosis not present

## 2015-02-04 DIAGNOSIS — S72142S Displaced intertrochanteric fracture of left femur, sequela: Secondary | ICD-10-CM

## 2015-02-04 DIAGNOSIS — Z96652 Presence of left artificial knee joint: Secondary | ICD-10-CM | POA: Diagnosis present

## 2015-02-04 DIAGNOSIS — M1652 Unilateral post-traumatic osteoarthritis, left hip: Secondary | ICD-10-CM | POA: Diagnosis present

## 2015-02-04 DIAGNOSIS — Z8781 Personal history of (healed) traumatic fracture: Secondary | ICD-10-CM | POA: Diagnosis not present

## 2015-02-04 DIAGNOSIS — R278 Other lack of coordination: Secondary | ICD-10-CM | POA: Diagnosis not present

## 2015-02-04 HISTORY — PX: TOTAL HIP ARTHROPLASTY: SHX124

## 2015-02-04 SURGERY — ARTHROPLASTY, HIP, TOTAL,POSTERIOR APPROACH
Anesthesia: General | Site: Hip | Laterality: Left

## 2015-02-04 MED ORDER — RIVAROXABAN 10 MG PO TABS
10.0000 mg | ORAL_TABLET | ORAL | Status: DC
  Administered 2015-02-05 – 2015-02-06 (×2): 10 mg via ORAL
  Filled 2015-02-04 (×3): qty 1

## 2015-02-04 MED ORDER — ROCURONIUM BROMIDE 50 MG/5ML IV SOLN
INTRAVENOUS | Status: AC
  Filled 2015-02-04: qty 1

## 2015-02-04 MED ORDER — PROPOFOL 10 MG/ML IV BOLUS
INTRAVENOUS | Status: AC
  Filled 2015-02-04: qty 20

## 2015-02-04 MED ORDER — ACETAMINOPHEN 10 MG/ML IV SOLN
1000.0000 mg | Freq: Four times a day (QID) | INTRAVENOUS | Status: AC
  Administered 2015-02-04 – 2015-02-05 (×4): 1000 mg via INTRAVENOUS
  Filled 2015-02-04 (×5): qty 100

## 2015-02-04 MED ORDER — CEFAZOLIN SODIUM-DEXTROSE 2-3 GM-% IV SOLR
2.0000 g | Freq: Four times a day (QID) | INTRAVENOUS | Status: AC
  Administered 2015-02-04 – 2015-02-05 (×2): 2 g via INTRAVENOUS
  Filled 2015-02-04 (×3): qty 50

## 2015-02-04 MED ORDER — ONDANSETRON HCL 4 MG PO TABS: 4.0000 mg | ORAL_TABLET | Freq: Four times a day (QID) | ORAL | Status: DC | PRN

## 2015-02-04 MED ORDER — ACETAMINOPHEN 10 MG/ML IV SOLN
INTRAVENOUS | Status: AC
  Filled 2015-02-04: qty 100

## 2015-02-04 MED ORDER — LACTATED RINGERS IV SOLN
INTRAVENOUS | Status: DC
  Administered 2015-02-04: 09:00:00 via INTRAVENOUS

## 2015-02-04 MED ORDER — FENTANYL CITRATE 0.05 MG/ML IJ SOLN
INTRAMUSCULAR | Status: AC
Start: 2015-02-04 — End: 2015-02-04
  Filled 2015-02-04: qty 5

## 2015-02-04 MED ORDER — SODIUM CHLORIDE 0.9 % IR SOLN
Status: DC | PRN
  Administered 2015-02-04: 1

## 2015-02-04 MED ORDER — SUCCINYLCHOLINE CHLORIDE 20 MG/ML IJ SOLN
INTRAMUSCULAR | Status: AC
  Filled 2015-02-04: qty 1

## 2015-02-04 MED ORDER — ACETAMINOPHEN 325 MG PO TABS: 650.0000 mg | ORAL_TABLET | Freq: Four times a day (QID) | ORAL | Status: DC | PRN

## 2015-02-04 MED ORDER — PHENYLEPHRINE HCL 10 MG/ML IJ SOLN
INTRAMUSCULAR | Status: DC | PRN
  Administered 2015-02-04 (×2): 80 ug via INTRAVENOUS

## 2015-02-04 MED ORDER — MAGNESIUM CITRATE PO SOLN: 1.0000 | Freq: Once | ORAL | Status: AC | PRN

## 2015-02-04 MED ORDER — METOCLOPRAMIDE HCL 5 MG/ML IJ SOLN: 5.0000 mg | Freq: Three times a day (TID) | INTRAMUSCULAR | Status: DC | PRN

## 2015-02-04 MED ORDER — MENTHOL 3 MG MT LOZG: 1.0000 | LOZENGE | OROMUCOSAL | Status: DC | PRN

## 2015-02-04 MED ORDER — METHOCARBAMOL 500 MG PO TABS
ORAL_TABLET | ORAL | Status: AC
  Administered 2015-02-04: 500 mg
  Filled 2015-02-04: qty 1

## 2015-02-04 MED ORDER — HYDROMORPHONE HCL 1 MG/ML IJ SOLN
INTRAMUSCULAR | Status: AC
  Filled 2015-02-04: qty 1

## 2015-02-04 MED ORDER — SODIUM CHLORIDE 0.9 % IV SOLN
75.0000 mL/h | INTRAVENOUS | Status: DC
  Administered 2015-02-05: 75 mL/h via INTRAVENOUS

## 2015-02-04 MED ORDER — GLYCOPYRROLATE 0.2 MG/ML IJ SOLN
INTRAMUSCULAR | Status: AC
  Filled 2015-02-04: qty 2

## 2015-02-04 MED ORDER — LIDOCAINE HCL (CARDIAC) 20 MG/ML IV SOLN
INTRAVENOUS | Status: AC
  Filled 2015-02-04: qty 5

## 2015-02-04 MED ORDER — ONDANSETRON HCL 4 MG/2ML IJ SOLN: 4.0000 mg | Freq: Four times a day (QID) | INTRAMUSCULAR | Status: DC | PRN

## 2015-02-04 MED ORDER — LACTATED RINGERS IV SOLN
INTRAVENOUS | Status: DC | PRN
  Administered 2015-02-04 (×2): via INTRAVENOUS

## 2015-02-04 MED ORDER — SENNOSIDES-DOCUSATE SODIUM 8.6-50 MG PO TABS: 1.0000 | ORAL_TABLET | Freq: Every evening | ORAL | Status: DC | PRN

## 2015-02-04 MED ORDER — BISACODYL 5 MG PO TBEC: 5.0000 mg | DELAYED_RELEASE_TABLET | Freq: Every day | ORAL | Status: DC | PRN

## 2015-02-04 MED ORDER — METHOCARBAMOL 1000 MG/10ML IJ SOLN
500.0000 mg | Freq: Four times a day (QID) | INTRAVENOUS | Status: DC | PRN
  Filled 2015-02-04 (×2): qty 5

## 2015-02-04 MED ORDER — NEOSTIGMINE METHYLSULFATE 10 MG/10ML IV SOLN
INTRAVENOUS | Status: AC
  Filled 2015-02-04: qty 1

## 2015-02-04 MED ORDER — ONDANSETRON HCL 4 MG/2ML IJ SOLN
INTRAMUSCULAR | Status: DC | PRN
  Administered 2015-02-04: 4 mg via INTRAVENOUS

## 2015-02-04 MED ORDER — LIDOCAINE HCL (CARDIAC) 20 MG/ML IV SOLN
INTRAVENOUS | Status: DC | PRN
  Administered 2015-02-04: 60 mg via INTRAVENOUS

## 2015-02-04 MED ORDER — FENTANYL CITRATE 0.05 MG/ML IJ SOLN
INTRAMUSCULAR | Status: DC | PRN
  Administered 2015-02-04: 50 ug via INTRAVENOUS
  Administered 2015-02-04: 150 ug via INTRAVENOUS
  Administered 2015-02-04 (×2): 100 ug via INTRAVENOUS
  Administered 2015-02-04 (×2): 50 ug via INTRAVENOUS

## 2015-02-04 MED ORDER — PROPOFOL 10 MG/ML IV BOLUS
INTRAVENOUS | Status: DC | PRN
  Administered 2015-02-04: 160 mg via INTRAVENOUS

## 2015-02-04 MED ORDER — ALBUMIN HUMAN 5 % IV SOLN
INTRAVENOUS | Status: DC | PRN
  Administered 2015-02-04: 11:00:00 via INTRAVENOUS

## 2015-02-04 MED ORDER — METOCLOPRAMIDE HCL 10 MG PO TABS: 5.0000 mg | ORAL_TABLET | Freq: Three times a day (TID) | ORAL | Status: DC | PRN

## 2015-02-04 MED ORDER — ONDANSETRON HCL 4 MG/2ML IJ SOLN
INTRAMUSCULAR | Status: AC
  Filled 2015-02-04: qty 2

## 2015-02-04 MED ORDER — KETOROLAC TROMETHAMINE 15 MG/ML IJ SOLN
INTRAMUSCULAR | Status: AC
  Administered 2015-02-04: 19:00:00
  Filled 2015-02-04: qty 1

## 2015-02-04 MED ORDER — FENTANYL CITRATE 0.05 MG/ML IJ SOLN
INTRAMUSCULAR | Status: AC
  Filled 2015-02-04: qty 5

## 2015-02-04 MED ORDER — HYDROMORPHONE HCL 1 MG/ML IJ SOLN: 0.5000 mg | INTRAMUSCULAR | Status: DC | PRN

## 2015-02-04 MED ORDER — NEOSTIGMINE METHYLSULFATE 10 MG/10ML IV SOLN
INTRAVENOUS | Status: DC | PRN
Start: 2015-02-04 — End: 2015-02-04
  Administered 2015-02-04: 3 mg via INTRAVENOUS

## 2015-02-04 MED ORDER — DOCUSATE SODIUM 100 MG PO CAPS
100.0000 mg | ORAL_CAPSULE | Freq: Two times a day (BID) | ORAL | Status: DC
  Administered 2015-02-04 – 2015-02-06 (×5): 100 mg via ORAL
  Filled 2015-02-04 (×9): qty 1

## 2015-02-04 MED ORDER — ACETAMINOPHEN 650 MG RE SUPP: 650.0000 mg | Freq: Four times a day (QID) | RECTAL | Status: DC | PRN

## 2015-02-04 MED ORDER — HYDROMORPHONE HCL 1 MG/ML IJ SOLN
0.2500 mg | INTRAMUSCULAR | Status: DC | PRN
  Administered 2015-02-04 (×2): 0.5 mg via INTRAVENOUS

## 2015-02-04 MED ORDER — ONDANSETRON HCL 4 MG/2ML IJ SOLN: 4.0000 mg | Freq: Once | INTRAMUSCULAR | Status: DC | PRN

## 2015-02-04 MED ORDER — TAMSULOSIN HCL 0.4 MG PO CAPS
0.4000 mg | ORAL_CAPSULE | Freq: Every day | ORAL | Status: DC
  Administered 2015-02-04 – 2015-02-06 (×3): 0.4 mg via ORAL
  Filled 2015-02-04 (×4): qty 1

## 2015-02-04 MED ORDER — KETOROLAC TROMETHAMINE 15 MG/ML IJ SOLN
7.5000 mg | Freq: Four times a day (QID) | INTRAMUSCULAR | Status: DC
  Administered 2015-02-04 – 2015-02-05 (×3): 7.5 mg via INTRAVENOUS
  Filled 2015-02-04: qty 1

## 2015-02-04 MED ORDER — ROCURONIUM BROMIDE 100 MG/10ML IV SOLN
INTRAVENOUS | Status: DC | PRN
  Administered 2015-02-04: 40 mg via INTRAVENOUS

## 2015-02-04 MED ORDER — METHOCARBAMOL 500 MG PO TABS
500.0000 mg | ORAL_TABLET | Freq: Four times a day (QID) | ORAL | Status: DC | PRN
Start: 2015-02-04 — End: 2015-02-07
  Administered 2015-02-05 – 2015-02-06 (×3): 500 mg via ORAL
  Filled 2015-02-04 (×4): qty 1

## 2015-02-04 MED ORDER — BUPIVACAINE-EPINEPHRINE (PF) 0.25% -1:200000 IJ SOLN
INTRAMUSCULAR | Status: AC
  Filled 2015-02-04: qty 30

## 2015-02-04 MED ORDER — OXYCODONE HCL 5 MG PO TABS
5.0000 mg | ORAL_TABLET | ORAL | Status: DC | PRN
  Administered 2015-02-05 (×3): 10 mg via ORAL
  Administered 2015-02-05: 5 mg via ORAL
  Administered 2015-02-05 – 2015-02-06 (×2): 10 mg via ORAL
  Administered 2015-02-06 (×2): 5 mg via ORAL
  Administered 2015-02-07: 10 mg via ORAL
  Filled 2015-02-04: qty 2
  Filled 2015-02-04: qty 1
  Filled 2015-02-04: qty 2
  Filled 2015-02-04: qty 1
  Filled 2015-02-04 (×4): qty 2
  Filled 2015-02-04: qty 1

## 2015-02-04 MED ORDER — PHENOL 1.4 % MT LIQD
1.0000 | OROMUCOSAL | Status: DC | PRN
Start: 2015-02-04 — End: 2015-02-07

## 2015-02-04 MED ORDER — ALUM & MAG HYDROXIDE-SIMETH 200-200-20 MG/5ML PO SUSP: 30.0000 mL | ORAL | Status: DC | PRN

## 2015-02-04 MED ORDER — BUPIVACAINE-EPINEPHRINE (PF) 0.25% -1:200000 IJ SOLN
INTRAMUSCULAR | Status: DC | PRN
Start: 2015-02-04 — End: 2015-02-04
  Administered 2015-02-04: 30 mL

## 2015-02-04 MED ORDER — GLYCOPYRROLATE 0.2 MG/ML IJ SOLN
INTRAMUSCULAR | Status: DC | PRN
  Administered 2015-02-04: 0.2 mg via INTRAVENOUS
  Administered 2015-02-04: 0.4 mg via INTRAVENOUS
  Administered 2015-02-04: 0.2 mg via INTRAVENOUS

## 2015-02-04 MED ORDER — PHENYLEPHRINE 40 MCG/ML (10ML) SYRINGE FOR IV PUSH (FOR BLOOD PRESSURE SUPPORT)
PREFILLED_SYRINGE | INTRAVENOUS | Status: AC
  Filled 2015-02-04: qty 10

## 2015-02-04 SURGICAL SUPPLY — 60 items
BLADE SAW SAG 73X25 THK (BLADE) ×2
BLADE SAW SGTL 73X25 THK (BLADE) ×1 IMPLANT
BRUSH FEMORAL CANAL (MISCELLANEOUS) IMPLANT
CAPT HIP TOTAL 2 ×3 IMPLANT
COVER BACK TABLE 24X17X13 BIG (DRAPES) IMPLANT
COVER SURGICAL LIGHT HANDLE (MISCELLANEOUS) ×6 IMPLANT
DRAPE IMP U-DRAPE 54X76 (DRAPES) ×3 IMPLANT
DRAPE INCISE IOBAN 66X45 STRL (DRAPES) IMPLANT
DRAPE ORTHO SPLIT 77X108 STRL (DRAPES) ×4
DRAPE PROXIMA HALF (DRAPES) ×3 IMPLANT
DRAPE SURG ORHT 6 SPLT 77X108 (DRAPES) ×2 IMPLANT
DRSG MEPILEX BORDER 4X12 (GAUZE/BANDAGES/DRESSINGS) ×3 IMPLANT
DURAPREP 26ML APPLICATOR (WOUND CARE) ×6 IMPLANT
ELECT BLADE 6.5 EXT (BLADE) ×3 IMPLANT
ELECT REM PT RETURN 9FT ADLT (ELECTROSURGICAL) ×3
ELECTRODE REM PT RTRN 9FT ADLT (ELECTROSURGICAL) ×1 IMPLANT
EVACUATOR 1/8 PVC DRAIN (DRAIN) IMPLANT
FACESHIELD WRAPAROUND (MASK) ×6 IMPLANT
GLOVE BIOGEL PI IND STRL 8 (GLOVE) ×2 IMPLANT
GLOVE BIOGEL PI IND STRL 8.5 (GLOVE) ×2 IMPLANT
GLOVE BIOGEL PI INDICATOR 8 (GLOVE) ×4
GLOVE BIOGEL PI INDICATOR 8.5 (GLOVE) ×4
GLOVE ECLIPSE 8.0 STRL XLNG CF (GLOVE) ×6 IMPLANT
GLOVE SURG ORTHO 8.5 STRL (GLOVE) ×6 IMPLANT
GOWN STRL REUS W/ TWL LRG LVL3 (GOWN DISPOSABLE) ×2 IMPLANT
GOWN STRL REUS W/TWL 2XL LVL3 (GOWN DISPOSABLE) ×6 IMPLANT
GOWN STRL REUS W/TWL LRG LVL3 (GOWN DISPOSABLE) ×4
HANDPIECE INTERPULSE COAX TIP (DISPOSABLE)
IMMOBILIZER KNEE 20 (SOFTGOODS) IMPLANT
IMMOBILIZER KNEE 22 UNIV (SOFTGOODS) ×3 IMPLANT
IMMOBILIZER KNEE 24 THIGH 36 (MISCELLANEOUS) IMPLANT
IMMOBILIZER KNEE 24 UNIV (MISCELLANEOUS)
KIT BASIN OR (CUSTOM PROCEDURE TRAY) ×3 IMPLANT
KIT ROOM TURNOVER OR (KITS) ×3 IMPLANT
MANIFOLD NEPTUNE II (INSTRUMENTS) ×3 IMPLANT
NEEDLE 22X1 1/2 (OR ONLY) (NEEDLE) ×3 IMPLANT
NS IRRIG 1000ML POUR BTL (IV SOLUTION) ×3 IMPLANT
PACK TOTAL JOINT (CUSTOM PROCEDURE TRAY) ×3 IMPLANT
PACK UNIVERSAL I (CUSTOM PROCEDURE TRAY) ×3 IMPLANT
PAD ARMBOARD 7.5X6 YLW CONV (MISCELLANEOUS) ×6 IMPLANT
PRESSURIZER FEMORAL UNIV (MISCELLANEOUS) IMPLANT
SET HNDPC FAN SPRY TIP SCT (DISPOSABLE) IMPLANT
STAPLER VISISTAT 35W (STAPLE) ×3 IMPLANT
SUCTION FRAZIER TIP 10 FR DISP (SUCTIONS) ×3 IMPLANT
SUT BONE WAX W31G (SUTURE) IMPLANT
SUT ETHIBOND NAB CT1 #1 30IN (SUTURE) ×9 IMPLANT
SUT MNCRL AB 3-0 PS2 18 (SUTURE) ×3 IMPLANT
SUT VIC AB 0 CT1 27 (SUTURE) ×4
SUT VIC AB 0 CT1 27XBRD ANBCTR (SUTURE) ×2 IMPLANT
SUT VIC AB 1 CT1 27 (SUTURE) ×4
SUT VIC AB 1 CT1 27XBRD ANBCTR (SUTURE) ×2 IMPLANT
SUT VIC AB 2-0 CT1 27 (SUTURE) ×2
SUT VIC AB 2-0 CT1 TAPERPNT 27 (SUTURE) ×1 IMPLANT
SYR CONTROL 10ML LL (SYRINGE) ×3 IMPLANT
TOWEL OR 17X24 6PK STRL BLUE (TOWEL DISPOSABLE) ×3 IMPLANT
TOWEL OR 17X26 10 PK STRL BLUE (TOWEL DISPOSABLE) ×3 IMPLANT
TOWER CARTRIDGE SMART MIX (DISPOSABLE) ×3 IMPLANT
TRAY FOLEY CATH 16FRSI W/METER (SET/KITS/TRAYS/PACK) ×3 IMPLANT
WATER STERILE IRR 1000ML POUR (IV SOLUTION) ×3 IMPLANT
YANKAUER SUCT BULB TIP NO VENT (SUCTIONS) ×6 IMPLANT

## 2015-02-04 NOTE — Progress Notes (Signed)
Report given to maria rn as cargiver 

## 2015-02-04 NOTE — Progress Notes (Signed)
Report given to maria rn as caregiver 

## 2015-02-04 NOTE — Anesthesia Preprocedure Evaluation (Signed)
Anesthesia Evaluation  Patient identified by MRN, date of birth, ID band  History of Anesthesia Complications Negative for: history of anesthetic complications  Airway Mallampati: I       Dental  (+) Partial Upper, Partial Lower   Pulmonary neg pulmonary ROS, former smoker,    Pulmonary exam normal       Cardiovascular negative cardio ROS  Rhythm:Irregular Rate:Normal     Neuro/Psych negative neurological ROS  negative psych ROS   GI/Hepatic Neg liver ROS, GERD-  ,  Endo/Other  negative endocrine ROS  Renal/GU negative Renal ROS     Musculoskeletal negative musculoskeletal ROS (+)   Abdominal   Peds  Hematology negative hematology ROS (+)   Anesthesia Other Findings   Reproductive/Obstetrics                             Anesthesia Physical Anesthesia Plan  ASA: II  Anesthesia Plan: General   Post-op Pain Management:    Induction:   Airway Management Planned: Oral ETT  Additional Equipment:   Intra-op Plan:   Post-operative Plan: Extubation in OR  Informed Consent:   Dental advisory given  Plan Discussed with:   Anesthesia Plan Comments:         Anesthesia Quick Evaluation

## 2015-02-04 NOTE — Anesthesia Procedure Notes (Signed)
Procedure Name: Intubation Date/Time: 02/04/2015 9:41 AM Performed by: Izora Gala Pre-anesthesia Checklist: Patient identified, Emergency Drugs available, Suction available and Patient being monitored Patient Re-evaluated:Patient Re-evaluated prior to inductionOxygen Delivery Method: Circle system utilized Preoxygenation: Pre-oxygenation with 100% oxygen Intubation Type: IV induction Ventilation: Mask ventilation without difficulty Laryngoscope Size: Miller and 3 Grade View: Grade I Tube type: Oral Tube size: 7.0 mm Number of attempts: 1 Airway Equipment and Method: Stylet and LTA kit utilized Placement Confirmation: ETT inserted through vocal cords under direct vision and positive ETCO2 Secured at: 22 cm Tube secured with: Tape Dental Injury: Teeth and Oropharynx as per pre-operative assessment

## 2015-02-04 NOTE — Anesthesia Postprocedure Evaluation (Signed)
  Anesthesia Post-op Note  Patient: Aaron Alvarez  Procedure(s) Performed: Procedure(s): LEFT TOTAL HIP ARTHROPLASTY (Left)  Patient Location: PACU  Anesthesia Type:General  Level of Consciousness: awake, alert , oriented and patient cooperative  Airway and Oxygen Therapy: Patient Spontanous Breathing  Post-op Pain: mild  Post-op Assessment: Post-op Vital signs reviewed, Patient's Cardiovascular Status Stable, Respiratory Function Stable, Patent Airway, No signs of Nausea or vomiting and Pain level controlled  Post-op Vital Signs: stable  Last Vitals:  Filed Vitals:   02/04/15 1330  BP: 141/72  Pulse: 52  Temp:   Resp: 13    Complications: No apparent anesthesia complications

## 2015-02-04 NOTE — H&P (Signed)
  The recent History & Physical has been reviewed. I have personally examined the patient today. There is no interval change to the documented History & Physical. The patient would like to proceed with the procedure.  [name] W 02/04/2015,  9:03 AM

## 2015-02-04 NOTE — Op Note (Signed)
PATIENT ID:      Aaron Alvarez  MRN:     [id number] DOB/AGE:    1925-09-20 / 81 y.o.       OPERATIVE REPORT    DATE OF PROCEDURE:  02/04/2015       PREOPERATIVE DIAGNOSIS:   LEFT HIP OSTEOARTHRITIS-PRIMARY, END STAGE                                                       Estimated body mass index is 29.89 kg/(m^2) as calculated from the following:   Height as of this encounter: 5\' 6"  (1.676 m).   Weight as of this encounter: 83.961 kg (185 lb 1.6 oz).     POSTOPERATIVE DIAGNOSIS:  SAME                                                                 Estimated body mass index is 29.89 kg/(m^2) as calculated from the following:   Height as of this encounter: 5\' 6"  (1.676 m).   Weight as of this encounter: 83.961 kg (185 lb 1.6 oz).     PROCEDURE:  Procedure(s): LEFT TOTAL HIP ARTHROPLASTY     SURGEON:  [name], MD    ASSISTANT:   Biagio Borg, PA-C   (Present and scrubbed throughout the case, critical for assistance with exposure, retraction, instrumentation, and closure.)          ANESTHESIA: general     DRAINS: none :      TOURNIQUET TIME: * No tourniquets in log *    COMPLICATIONS:  None   CONDITION:  stable  PROCEDURE IN DETAIL:  [id number]   Aaron Alvarez 02/04/2015, 11:39 AM

## 2015-02-04 NOTE — Progress Notes (Signed)
Utilization review completed.  

## 2015-02-04 NOTE — Transfer of Care (Signed)
Immediate Anesthesia Transfer of Care Note  Patient: Aaron Alvarez  Procedure(s) Performed: Procedure(s): LEFT TOTAL HIP ARTHROPLASTY (Left)  Patient Location: PACU  Anesthesia Type:General  Level of Consciousness: awake, alert , sedated and patient cooperative  Airway & Oxygen Therapy: Patient Spontanous Breathing and Patient connected to face mask oxygen  Post-op Assessment: Report given to RN, Post -op Vital signs reviewed and stable and Patient moving all extremities  Post vital signs: Reviewed and stable  Last Vitals:  Filed Vitals:   02/04/15 0859  BP: 168/85  Pulse: 65  Temp: 36.6 C  Resp: 20    Complications: No apparent anesthesia complications

## 2015-02-04 NOTE — Progress Notes (Signed)
Orthopedic Tech Progress Note Patient Details:  Aaron Alvarez 1925/05/12 [id number] Patient already has knee imobilizer. Patient ID: [name], male   DOB: 20-Jan-1925, 81 y.o.   MRN: [id number]   Braulio Bosch 02/04/2015, 7:02 PM

## 2015-02-05 ENCOUNTER — Encounter (HOSPITAL_COMMUNITY): Payer: Self-pay | Admitting: Orthopaedic Surgery

## 2015-02-05 LAB — BASIC METABOLIC PANEL
Anion gap: 4 — ABNORMAL LOW (ref 5–15)
BUN: 16 mg/dL (ref 6–23)
CO2: 28 mmol/L (ref 19–32)
Calcium: 8.6 mg/dL (ref 8.4–10.5)
Chloride: 102 mmol/L (ref 96–112)
Creatinine, Ser: 0.9 mg/dL (ref 0.50–1.35)
GFR, EST AFRICAN AMERICAN: 86 mL/min — AB (ref >90)
GFR, EST NON AFRICAN AMERICAN: 74 mL/min — AB (ref >90)
Glucose, Bld: 118 mg/dL — ABNORMAL HIGH (ref 70–99)
POTASSIUM: 3.8 mmol/L (ref 3.5–5.1)
Sodium: 134 mmol/L — ABNORMAL LOW (ref 135–145)

## 2015-02-05 LAB — CBC
HEMATOCRIT: 25.2 % — AB (ref 39.0–52.0)
HEMATOCRIT: 27.1 % — AB (ref 39.0–52.0)
HEMOGLOBIN: 8.6 g/dL — AB (ref 13.0–17.0)
Hemoglobin: 9.4 g/dL — ABNORMAL LOW (ref 13.0–17.0)
MCH: 29.6 pg (ref 26.0–34.0)
MCH: 29.7 pg (ref 26.0–34.0)
MCHC: 34.1 g/dL (ref 30.0–36.0)
MCHC: 34.7 g/dL (ref 30.0–36.0)
MCV: 86.6 fL (ref 78.0–100.0)
MCV: 85.5 fL (ref 78.0–100.0)
Platelets: 148 10*3/uL — ABNORMAL LOW (ref 150–400)
Platelets: 156 10*3/uL (ref 150–400)
RBC: 2.91 MIL/uL — ABNORMAL LOW (ref 4.22–5.81)
RBC: 3.17 MIL/uL — ABNORMAL LOW (ref 4.22–5.81)
RDW: 12.4 % (ref 11.5–15.5)
RDW: 12.5 % (ref 11.5–15.5)
WBC: 5.3 10*3/uL (ref 4.0–10.5)
WBC: 6.8 10*3/uL (ref 4.0–10.5)

## 2015-02-05 LAB — PREPARE RBC (CROSSMATCH)

## 2015-02-05 MED ORDER — ACETAMINOPHEN 325 MG PO TABS
650.0000 mg | ORAL_TABLET | Freq: Once | ORAL | Status: AC
  Administered 2015-02-05: 650 mg via ORAL
  Filled 2015-02-05: qty 2

## 2015-02-05 MED ORDER — KETOROLAC TROMETHAMINE 15 MG/ML IJ SOLN
INTRAMUSCULAR | Status: AC
  Filled 2015-02-05: qty 1

## 2015-02-05 MED ORDER — SODIUM CHLORIDE 0.9 % IV SOLN
Freq: Once | INTRAVENOUS | Status: AC
  Administered 2015-02-05: 16:00:00 via INTRAVENOUS

## 2015-02-05 MED ORDER — SODIUM CHLORIDE 0.9 % IV SOLN: Freq: Once | INTRAVENOUS | Status: DC

## 2015-02-05 NOTE — Progress Notes (Signed)
Patient unable to void spontaneously after removal of foley catheter this AM. No urge to urinate. Patient bladder scanned and 471ml of urine shown. Will in and out cath per protocol and continue to monitor for void.

## 2015-02-05 NOTE — Progress Notes (Signed)
Patient ID: Aaron Alvarez, male   DOB: 1925-08-06, 81 y.o.   MRN: [id number] PATIENT ID: Aaron Alvarez        MRN:  [id number]          DOB/AGE: [birth year] / 81 y.o.    [name], MD   Biagio Borg, PA-C [address] [place],   [address]                             [phone]   PROGRESS NOTE  Subjective:  negative for Chest Pain  negative for Shortness of Breath  positive for Nausea/Vomiting   negative for Calf Pain    Tolerating Diet: yes         Patient reports pain as mild.     Vomited once in RR none since-fine this am  Objective: Vital signs in last 24 hours:   Patient Vitals for the past 24 hrs:  BP Temp Temp src Pulse Resp SpO2 Height Weight  02/05/15 0550 (!) 103/45 mmHg 98.6 F (37 C) Oral 69 17 94 % - -  02/05/15 0234 (!) 113/42 mmHg 98.1 F (36.7 C) Oral 76 16 96 % - -  02/04/15 2000 - - - - 20 94 % - -  02/04/15 1955 (!) 126/57 mmHg 98 F (36.7 C) - 72 16 96 % - -  02/04/15 1610 140/61 mmHg 98 F (36.7 C) Oral 67 16 100 % - -  02/04/15 1530 - 97.5 F (36.4 C) - 78 15 100 % - -  02/04/15 1515 - - - 65 16 100 % - -  02/04/15 1500 - - - (!) 58 17 100 % - -  02/04/15 1432 - - - 71 19 100 % - -  02/04/15 1430 (!) 126/55 mmHg - - (!) 58 10 100 % - -  02/04/15 1415 (!) 114/49 mmHg - - (!) 56 14 100 % - -  02/04/15 1400 (!) 130/52 mmHg 97.6 F (36.4 C) - (!) 54 13 100 % - -  02/04/15 1345 135/64 mmHg - - (!) 53 11 100 % - -  02/04/15 1330 (!) 141/72 mmHg - - (!) 52 13 100 % - -  02/04/15 1315 135/60 mmHg - - (!) 52 13 100 % - -  02/04/15 1300 (!) 129/57 mmHg - - (!) 59 16 100 % - -  02/04/15 1245 (!) 129/57 mmHg - - 62 15 - - -  02/04/15 1230 (!) 104/58 mmHg - - 61 15 100 % - -  02/04/15 1215 131/61 mmHg - - 62 16 - - -  02/04/15 1200 (!) 147/66 mmHg 98.3 F (36.8 C) - 68 14 100 % - -  02/04/15 0859 (!) 168/85 mmHg 97.8 F (36.6 C) Oral 65 20 100 % - -  02/04/15 0821 - - - - - - 5\' 6"  (1.676 m) 83.961 kg (185 lb 1.6 oz)        Intake/Output from previous day:   02/09 0701 - 02/10 0700 In: 3040 [P.O.:240; I.V.:2300] Out: 2151 [Urine:1450]   Intake/Output this shift:       Intake/Output      02/09 0701 - 02/10 0700 02/10 0701 - 02/11 0700   P.O. 240    I.V. (mL/kg) 2300 (27.4)    IV Piggyback 500    Total Intake(mL/kg) 3040 (36.2)    Urine (mL/kg/hr) 1450    Emesis/NG output 1    Blood 700  Total Output 2151     Net +889             LABORATORY DATA: No results for input(s): WBC, HGB, HCT, PLT in the last 168 hours. No results for input(s): NA, K, CL, CO2, BUN, CREATININE, GLUCOSE, CALCIUM in the last 168 hours. Lab Results  Component Value Date   INR 1.02 01/27/2015    Recent Radiographic Studies :  Dg Chest 2 View  01/27/2015   CLINICAL DATA:  Smoker.  Preop chest x-ray  EXAM: CHEST  2 VIEW  COMPARISON:  10/19/2005.  FINDINGS: Mediastinum and hilar structures normal. Lungs are clear. Heart size normal. No pleural effusion or pneumothorax. Deformity noted of the left posterior ribs cyst with prior healed fracture. Degenerative changes thoracic spine . Postsurgical changes right shoulder.  IMPRESSION: No acute cardiopulmonary disease.   Electronically Signed   By: Marcello Moores  Register   On: 01/27/2015 10:46   Dg Pelvis 1-2 Views  02/04/2015   CLINICAL DATA:  81 year old male status post left total hip arthroplasty  EXAM: PELVIS - 1-2 VIEW  COMPARISON:  None.  FINDINGS: Single frontal radiograph of the pelvis demonstrates surgical changes of left total hip arthroplasty. No evidence of periprosthetic fracture or other acute complication. Expected subcutaneous emphysema. Moderate right hip degenerative osteoarthritis with prominent lateral acetabular osteophyte.  IMPRESSION: Left total hip arthroplasty without evidence of acute complication.   Electronically Signed   By: Jacqulynn Cadet M.D.   On: 02/04/2015 16:08     Examination:  General appearance: alert, cooperative and no distress  Wound Exam:  clean, dry, intact   Drainage:  None: wound tissue dry  Motor Exam: EHL, FHL, Anterior Tibial and Posterior Tibial Intact  Sensory Exam: Superficial Peroneal, Deep Peroneal and Tibial normal  Vascular Exam: Normal  Assessment:    1 Day Post-Op  Procedure(s) (LRB): LEFT TOTAL HIP ARTHROPLASTY (Left)  ADDITIONAL DIAGNOSIS:  Active Problems:   S/P total hip arthroplasty     Plan: Physical Therapy as ordered Weight Bearing as Tolerated (WBAT)  DVT Prophylaxis:  Xarelto, Foot Pumps and TED hose  DISCHARGE PLAN: Skilled Nursing Facility/Rehab-[facility]  DISCHARGE NEEDS: HHPT, CPM, Walker and 3-in-1 comode seat   OOB with PT, foley out, check lab, socialk services to assist with transfer to [facility], [place] W  02/05/2015 8:01 AM

## 2015-02-05 NOTE — Evaluation (Signed)
Physical Therapy Evaluation Patient Details Name: Aaron Alvarez MRN: [id number] DOB: [birth year] Today's Date: 02/05/2015   History of Present Illness  Pt is a very active 81 y.o. male s/p Lt THA.   Clinical Impression  Pt is s/p Lt posteriorTHA resulting in the deficits listed below (see PT Problem List). Pt will benefit from skilled PT to increase their independence and safety with mobility to allow discharge to the venue listed below. Pt very active and motivated to return to golf.      Follow Up Recommendations SNF;Supervision/Assistance - 24 hour    Equipment Recommendations  3in1 (PT)    Recommendations for Other Services       Precautions / Restrictions Precautions Precautions: Posterior Hip Precaution Comments: educated on hip precautionis Required Braces or Orthoses: Knee Immobilizer - Left Restrictions Weight Bearing Restrictions: Yes LLE Weight Bearing: Weight bearing as tolerated      Mobility  Bed Mobility Overal bed mobility: Needs Assistance Bed Mobility: Supine to Sit     Supine to sit: Min guard;HOB elevated     General bed mobility comments: cues for posterior hip precautions; relies on handrails   Transfers Overall transfer level: Needs assistance Equipment used: Rolling walker (2 wheeled) Transfers: Sit to/from Stand Sit to Stand: Min assist         General transfer comment: cues for hand placement and posterior hip precautions; min (A) to balance   Ambulation/Gait Ambulation/Gait assistance: Min assist Ambulation Distance (Feet): 12 Feet Assistive device: Rolling walker (2 wheeled) Gait Pattern/deviations: Step-to pattern;Decreased stance time - left;Decreased step length - right;Narrow base of support;Antalgic Gait velocity: decr Gait velocity interpretation: Below normal speed for age/gender General Gait Details: cues for step through gt sequencing and upright posture; pt c/o intermittent dizziness limiting mobility  Stairs            Wheelchair Mobility    Modified Rankin (Stroke Patients Only)       Balance Overall balance assessment: Needs assistance Sitting-balance support: Feet supported;No upper extremity supported Sitting balance-Leahy Scale: Good     Standing balance support: During functional activity;Bilateral upper extremity supported Standing balance-Leahy Scale: Poor Standing balance comment: RW to balance                              Pertinent Vitals/Pain Pain Assessment: 0-10 Pain Score: 2  Pain Location: Lt hip Pain Descriptors / Indicators: Aching Pain Intervention(s): Monitored during session;Premedicated before session;Repositioned    Home Living Family/patient expects to be discharged to:: Skilled nursing facility Living Arrangements: Spouse/significant other               Additional Comments: pre-arranged for [employer]    Prior Function Level of Independence: Independent with assistive device(s)         Comments: ambulated with cane due to pain      Hand Dominance        Extremity/Trunk Assessment   Upper Extremity Assessment: Defer to OT evaluation           Lower Extremity Assessment: LLE deficits/detail   LLE Deficits / Details: knee flexion limited to 50 degrees due to previous knee surgery  Cervical / Trunk Assessment: Normal  Communication   Communication: HOH  Cognition Arousal/Alertness: Awake/alert Behavior During Therapy: WFL for tasks assessed/performed Overall Cognitive Status: Within Functional Limits for tasks assessed       Memory: Decreased recall of precautions  General Comments      Exercises Total Joint Exercises Ankle Circles/Pumps: AROM;Both;10 reps Quad Sets: AROM;Left;10 reps Hip ABduction/ADduction: AAROM;Left;10 reps;Seated Long Arc Quad: AROM;Left;10 reps;Seated      Assessment/Plan    PT Assessment Patient needs continued PT services  PT Diagnosis Difficulty  walking;Generalized weakness;Acute pain   PT Problem List Decreased strength;Decreased range of motion;Decreased activity tolerance;Decreased balance;Decreased mobility;Decreased knowledge of precautions;Decreased knowledge of use of DME;Pain  PT Treatment Interventions DME instruction;Gait training;Stair training;Functional mobility training;Therapeutic activities;Therapeutic exercise;Balance training;Neuromuscular re-education;Patient/family education   PT Goals (Current goals can be found in the Care Plan section) Acute Rehab PT Goals Patient Stated Goal: to go to [facility] then home and start playing golf again PT Goal Formulation: With patient Time For Goal Achievement: 02/12/15 Potential to Achieve Goals: Good    Frequency 7X/week   Barriers to discharge Decreased caregiver support wife unable to provide physical (A)     Co-evaluation               End of Session Equipment Utilized During Treatment: Gait belt Activity Tolerance: Patient tolerated treatment well;Other (comment) (c/o dizziness with ambulation) Patient left: in chair;with call bell/phone within reach Nurse Communication: Mobility status;Precautions         Time: 3785-8850 PT Time Calculation (min) (ACUTE ONLY): 34 min   Charges:   PT Evaluation $Initial PT Evaluation Tier I: 1 Procedure PT Treatments $Gait Training: 8-22 mins   PT G CodesGustavus Bryant, [name] 02/05/2015, 8:45 AM

## 2015-02-05 NOTE — Progress Notes (Signed)
Physical Therapy Treatment Patient Details Name: Aaron Alvarez RANGE MRN: [id number] DOB: June 05, 1925 Today's Date: 02/05/2015    History of Present Illness Pt is a very active 81 y.o. male s/p Lt THA.     PT Comments    Pt moves very slow but is very motivated to return to independence. Pt mobilizing at min (A) to min guard. Cont to follow per POC till D/C to SNF.   Follow Up Recommendations  SNF;Supervision/Assistance - 24 hour     Equipment Recommendations  3in1 (PT)    Recommendations for Other Services       Precautions / Restrictions Precautions Precautions: Posterior Hip Precaution Booklet Issued: Yes (comment) Precaution Comments: pt given handout to reinforce hip precautions  Required Braces or Orthoses: Knee Immobilizer - Left Knee Immobilizer - Left: Other (comment) (no orders; in bed for hip precautions ? ) Restrictions Weight Bearing Restrictions: Yes LLE Weight Bearing: Weight bearing as tolerated    Mobility  Bed Mobility Overal bed mobility: Needs Assistance Bed Mobility: Sit to Supine       Sit to supine: Min guard   General bed mobility comments: reviewed posterior hip precautions; educated on use of sheet for leg lift; pt nauseous after returning to supine   Transfers Overall transfer level: Needs assistance Equipment used: Rolling walker (2 wheeled) Transfers: Sit to/from Stand Sit to Stand: Min assist         General transfer comment: pt transferred from multiple heightened levels; cues for posterior hip precautions and safety with RW: min (A) to balance ; pt guarding and limited in Lt knee flexion   Ambulation/Gait Ambulation/Gait assistance: Min guard Ambulation Distance (Feet): 50 Feet Assistive device: Rolling walker (2 wheeled) Gait Pattern/deviations: Step-to pattern;Decreased stance time - left;Decreased step length - right;Antalgic (decr Lt knee flexion ) Gait velocity: decr Gait velocity interpretation: Below normal speed for  age/gender General Gait Details: cues for upright posture and step through gt ; no c/o dizziness this session    Stairs            Wheelchair Mobility    Modified Rankin (Stroke Patients Only)       Balance Overall balance assessment: Needs assistance Sitting-balance support: Feet supported;No upper extremity supported Sitting balance-Leahy Scale: Good     Standing balance support: During functional activity;Bilateral upper extremity supported Standing balance-Leahy Scale: Poor Standing balance comment: RW at all times                     Cognition Arousal/Alertness: Awake/alert Behavior During Therapy: WFL for tasks assessed/performed Overall Cognitive Status: Within Functional Limits for tasks assessed       Memory: Decreased recall of precautions              Exercises Total Joint Exercises Ankle Circles/Pumps: AROM;Both;10 reps Gluteal Sets: 10 reps Hip ABduction/ADduction: AAROM;Left;Seated;15 reps Long Arc Quad: AROM;Left;Seated;15 reps    General Comments General comments (skin integrity, edema, etc.): BP 115/55 c/o nausea after activity      Pertinent Vitals/Pain Pain Assessment: 0-10 Pain Score: 4  Pain Location: Lt hip with exercises  Pain Descriptors / Indicators: Sore Pain Intervention(s): Monitored during session;Premedicated before session;Repositioned    Home Living                      Prior Function            PT Goals (current goals can now be found in the care plan section) Acute Rehab PT  Goals Patient Stated Goal: to get back to putting PT Goal Formulation: With patient Time For Goal Achievement: 02/12/15 Potential to Achieve Goals: Good Progress towards PT goals: Progressing toward goals    Frequency  7X/week    PT Plan Current plan remains appropriate    Co-evaluation             End of Session Equipment Utilized During Treatment: Gait belt Activity Tolerance: Patient tolerated treatment  well Patient left: in bed;with call bell/phone within reach;with nursing/sitter in room     Time: 9407-6808 PT Time Calculation (min) (ACUTE ONLY): 31 min  Charges:  $Gait Training: 8-22 mins $Therapeutic Exercise: 8-22 mins                    G CodesGustavus Bryant, [place]  [phone] 02/05/2015, 4:34 PM

## 2015-02-05 NOTE — Discharge Instructions (Signed)
Information on my medicine - XARELTO® (Rivaroxaban) ° °This medication education was reviewed with me or my healthcare representative as part of my discharge preparation.  The pharmacist that spoke with me during my hospital stay was:  Olivia Royse P, RPH ° °Why was Xarelto® prescribed for you? °Xarelto® was prescribed for you to reduce the risk of blood clots forming after orthopedic surgery. The medical term for these abnormal blood clots is venous thromboembolism (VTE). ° °What do you need to know about xarelto® ? °Take your Xarelto® ONCE DAILY at the same time every day. °You may take it either with or without food. ° °If you have difficulty swallowing the tablet whole, you may crush it and mix in applesauce just prior to taking your dose. ° °Take Xarelto® exactly as prescribed by your doctor and DO NOT stop taking Xarelto® without talking to the doctor who prescribed the medication.  Stopping without other VTE prevention medication to take the place of Xarelto® may increase your risk of developing a clot. ° °After discharge, you should have regular check-up appointments with your healthcare provider that is prescribing your Xarelto®.   ° °What do you do if you miss a dose? °If you miss a dose, take it as soon as you remember on the same day then continue your regularly scheduled once daily regimen the next day. Do not take two doses of Xarelto® on the same day.  ° °Important Safety Information °A possible side effect of Xarelto® is bleeding. You should call your healthcare provider right away if you experience any of the following: °? Bleeding from an injury or your nose that does not stop. °? Unusual colored urine (red or dark brown) or unusual colored stools (red or black). °? Unusual bruising for unknown reasons. °? A serious fall or if you hit your head (even if there is no bleeding). ° °Some medicines may interact with Xarelto® and might increase your risk of bleeding while on Xarelto®. To help avoid  this, consult your healthcare provider or pharmacist prior to using any new prescription or non-prescription medications, including herbals, vitamins, non-steroidal anti-inflammatory drugs (NSAIDs) and supplements. ° °This website has more information on Xarelto®: www.xarelto.com. ° ° °

## 2015-02-05 NOTE — Clinical Social Work Placement (Addendum)
Clinical Social Work Department CLINICAL SOCIAL WORK PLACEMENT NOTE 02/05/2015  Patient:  Aaron Alvarez, Aaron Alvarez  Account Number:  [id number] Admit date:  02/04/2015  Clinical Social Worker:  Delrae Sawyers  Date/time:  02/05/2015 03:40 PM  Clinical Social Work is seeking post-discharge placement for this patient at the following level of care:   SKILLED NURSING   (*CSW will update this form in Epic as items are completed)   02/05/2015  Patient/family provided with [facility] Department of Clinical Social Work's list of facilities offering this level of care within the geographic area requested by the patient (or if unable, by the patient's family).  02/05/2015  Patient/family informed of their freedom to choose among providers that offer the needed level of care, that participate in Medicare, Medicaid or managed care program needed by the patient, have an available bed and are willing to accept the patient.  02/05/2015  Patient/family informed of [facility]' ownership interest in [facility], as well as of the fact that they are under no obligation to receive care at this facility.  PASARR submitted to EDS on 02/05/2015 PASARR number received on 02/05/2015  FL2 transmitted to all facilities in geographic area requested by pt/family on  02/05/2015 FL2 transmitted to all facilities within larger geographic area on   Patient informed that his/her managed care company has contracts with or will negotiate with  certain facilities, including the following:     Patient/family informed of bed offers received:  02/05/2015 Patient chooses bed at [facility] Physician recommends and patient chooses bed at    Patient to be transferred to [facility] on  02/07/2015 Patient to be transferred to facility by Patient's son to provide transportation. Patient and family notified of transfer on 02/07/2015 Name of family member notified:  Patient updated at bedside.  The following  physician request were entered in Epic:   Additional Comments:  Henderson [place] [phone]) Licensed Clinical Social Worker Orthopedics [phone]) and Surgical [phone])

## 2015-02-05 NOTE — Op Note (Signed)
NAMEABHIJOT, STRAUGHTER NO.:  [id number]  MEDICAL RECORD NO.:  [id number]  LOCATION:  5N25C                        FACILITY:  [facility]  PHYSICIAN:  Vonna Kotyk. Whitfield, M.D.DATE OF BIRTH:  [birth year]  DATE OF PROCEDURE:  02/04/2015 DATE OF DISCHARGE:                              OPERATIVE REPORT   PREOPERATIVE DIAGNOSIS:  End-stage primary osteoarthritis, left hip.  POSTOPERATIVE DIAGNOSIS:  End-stage primary osteoarthritis, left hip.  PROCEDURE:  Left total hip replacement.  SURGEON:  Vonna Kotyk. Durward Fortes, M.D.  DESCRIPTION OF PROCEDURE:  Mr. Temme was met in the holding area, identified the left hip was appropriate operative site, marked it accordingly.  Neurovascular exam appeared to be intact.  He was close to 1 inch short on that side with limited range of motion.  The patient was then transported to room #7 and placed under general anesthesia without difficulty.  The patient was then placed in the lateral decubitus position with the left side up and secured to the operating room table with the Innomed hip system.  The left lower extremity was then prepped with chlorhexidine scrub and DuraPrep from iliac crest to the ankle, sterile draping was performed.  Time-out was performed.  A routine Southern incision was utilized and via sharp dissection, carried down to subcutaneous tissue.  Gross bleeders were Bovie coagulated.  Adipose tissue was incised to the level of the IT band. This was incised along with the skin incision.  Self-retaining retractors were inserted via blunt dissection.  The soft tissue was then elevated.  I found a nice relatively avascular plane within the gluteal muscles and retracted them and retractors were inserted more deeply.  I could palpate the short external rotators and they were carefully incised.  The tendinous structures were tagged with 0 Ethibond suture.  The capsule was identified and incised along the femoral neck and  head. Joint was entered.  There was a very small clear yellow joint effusion. At that point, I could palpate the rim of the acetabulum and the head. The head was then dislocated posteriorly.  Using an oscillating saw, the head was osteotomized from its neck using a calcar guide.  The head was devoid of articular cartilage and somewhat oval in shape.  The joint was inspected.  There was a few diffuse small osteocartilaginous loose bodies.  The patient has had a prior intertrochanteric fracture about 4 years ago and because of the prior injury, that surgery was somewhat technically difficult as the anatomy had significantly changed.  I found the piriformis fossa.  I made a small hole and then used the canal Finders to find the canal which was a little bit anterior.  I then carefully reamed to 14.5 mm to accept a 15 mm component.  Rasping was then performed sequentially to 15 mm standard with about 15 degrees of anteversion.  This fit nicely on the calcar.  I used a calcar reamer to obtain the appropriate angle.  There were osteophytes I removed anteriorly and posteriorly.  Retractors were then placed about the acetabulum.  There were some adhesions within the capsule that  was somewhat foreshortened and I just did a very careful capsule release along the inferior  aspect of the acetabulum.  There were large osteophytes some of which were loose and these were removed.  I then carefully reamed starting at 45 mm to 53 mm outer diameter.  I had nice bleeding cancellous bone, but I had definitely had deepened the acetabulum which seemed to be somewhat shallow.  I then checked the 52 mm outer diameter trial component, thought I had nice rim fit but would completely seat, so therefore I elected to use the 54 mm outer diameter component.  The sector 3 Gription acetabular component was then impacted into place.  I had a nice tight fit, did not think any to any further security with  screws.  The trial polyethylene component was then screwed into place. and I then re-inserted the 15 mm standard femoral rasp and then trialed several neck lengths and felt that the +5 neck length with a 36 mm outer diameter hip ball was the most stable.  There was impingement anteriorly with flexion, and so then I removed the anterior osteophytes and at that point did not feel like there was any impingement or toggling.  I had nice stable construct without toggling and I thought I had re- established leg lengths too close to the normal length.  The trial components were removed.  I copiously irrigated the joint, inserted an apex hole eliminator, followed by the marathon polyethylene liner +4 with a 10-degree posterior lip.  I had nice tight fit.  Wound was again irrigated with saline solution.  I then carefully impacted the standard  femoral component, flushed on the calcar.  I cleaned the Morse taper neck and then applied the 36-mm outer diameter hip ball with a +5 neck length, and I carefully reduced the entire construct.  At that point, the hip was nice and stable in flexion and extension, did not feel appeared to be too tight.  The wound was again irrigated with saline solution.  The deep capsule was then closed with 0 Ethibond.  The  the short external rotators closed with a similar material.  The wound was again irrigated.  The IT band was closed with running #1 Vicryl, subcu in several layers with 2-0 Vicryl and 3-0 Monocryl, skin closed with skin clips.  Sterile bulky dressing was applied.  The patient tolerated the procedure without complications.  Again,  the procedure was somewhat technically difficult because of the prior fracture and distorted anatomy and also the fact that he had a prior left total knee replacement with adhesive capsulitis allowing only approximately 45-60 degrees of flexion.     Vonna Kotyk. Durward Fortes, M.D.     PWW/MEDQ  D:  02/04/2015  T:   02/05/2015  Job:  [id number]

## 2015-02-05 NOTE — Clinical Social Work Psychosocial (Signed)
Clinical Social Work Department BRIEF PSYCHOSOCIAL ASSESSMENT 02/05/2015  Patient:  Aaron Alvarez, Aaron Alvarez     Account Number:  [id number]     Admit date:  02/04/2015  Clinical Social Worker:  Delrae Sawyers  Date/Time:  02/05/2015 03:34 PM  Referred by:  Physician  Date Referred:  02/05/2015 Referred for  SNF Placement   Other Referral:   none.   Interview type:  Patient Other interview type:   none.    PSYCHOSOCIAL DATA Living Status:  WIFE Admitted from facility:   Level of care:   Primary support name:  Aaron Alvarez Primary support relationship to patient:  SPOUSE Degree of support available:   Strong support system.    CURRENT CONCERNS Current Concerns  Post-Acute Placement   Other Concerns:   none.    SOCIAL WORK ASSESSMENT / PLAN CSW received referral for possible SNF placement at time of discharge. CSW met with patient at bedside to discuss discharge disposition. Patient informed CSW patient is to be discharged to Aaron Alvarez at time of discharge. Patient stated patient's wife has "bad arthritis" and is unable to assist patient post-discharge. Per patient, patient has made arrangements with Aaron Alvarez prior to admission to Aaron Alvarez.    Patient presented as pleasant and hopeful to complete short-term rehabilitation to be able to return home with patient's wife. Patient expressed SNF placement was patient's idea as patient's wife cares for patient and patient's wife "Schizophrenic son" and would not be able to also care for patient at time of discharge.    CSW to continue to follow and assist with discharge planning needs.   Assessment/plan status:  Psychosocial Support/Ongoing Assessment of Needs Other assessment/ plan:   none.   Information/referral to community resources:   Patient to be discharged to [facility]varez.    PATIENT'S/FAMILY'S RESPONSE TO PLAN OF CARE: Patient understanding and agreeable to CSW plan of care. Patient expressed  no further questions or concerns at this time.       Aaron Alvarez, [place] [phone]) Licensed Clinical Social Worker Orthopedics [phone]) and Surgical [phone])

## 2015-02-05 NOTE — Progress Notes (Signed)
02/05/15 Set up with [facility] for HHPT by MD office. PT recommended SNF. Referral made to CSW. Cece Milhouse with [facility] informed of change in d/c plan. Will continue to follow until d/c.

## 2015-02-05 NOTE — Progress Notes (Signed)
OT Cancellation Note  Patient Details Name: Aaron Alvarez MRN: [id number] DOB: [birth year]   Cancelled Treatment:    Reason Eval/Treat Not Completed: Other (comment) Pt is Medicare and has been setup to d/c to SNF. No apparent immediate acute care OT needs, therefore will defer OT to SNF. If OT eval is needed please call Acute Rehab Dept. at [phone] or text page OT at [phone].    Villa Herb M   Cyndie Chime, OTR/L Occupational Therapist [phone] (pager)  02/05/2015, 9:45 AM

## 2015-02-06 DIAGNOSIS — D62 Acute posthemorrhagic anemia: Secondary | ICD-10-CM | POA: Diagnosis not present

## 2015-02-06 DIAGNOSIS — M1652 Unilateral post-traumatic osteoarthritis, left hip: Secondary | ICD-10-CM | POA: Diagnosis present

## 2015-02-06 LAB — CBC
HCT: 25.6 % — ABNORMAL LOW (ref 39.0–52.0)
HEMATOCRIT: 28.8 % — AB (ref 39.0–52.0)
Hemoglobin: 8.8 g/dL — ABNORMAL LOW (ref 13.0–17.0)
Hemoglobin: 9.8 g/dL — ABNORMAL LOW (ref 13.0–17.0)
MCH: 30.2 pg (ref 26.0–34.0)
MCH: 29.3 pg (ref 26.0–34.0)
MCHC: 34.4 g/dL (ref 30.0–36.0)
MCHC: 34 g/dL (ref 30.0–36.0)
MCV: 88 fL (ref 78.0–100.0)
MCV: 86.2 fL (ref 78.0–100.0)
PLATELETS: 125 10*3/uL — AB (ref 150–400)
Platelets: 130 10*3/uL — ABNORMAL LOW (ref 150–400)
RBC: 2.91 MIL/uL — AB (ref 4.22–5.81)
RBC: 3.34 MIL/uL — ABNORMAL LOW (ref 4.22–5.81)
RDW: 12.7 % (ref 11.5–15.5)
RDW: 12.7 % (ref 11.5–15.5)
WBC: 5.9 10*3/uL (ref 4.0–10.5)
WBC: 7.4 10*3/uL (ref 4.0–10.5)

## 2015-02-06 LAB — BASIC METABOLIC PANEL
Anion gap: 7 (ref 5–15)
BUN: 12 mg/dL (ref 6–23)
CALCIUM: 8.6 mg/dL (ref 8.4–10.5)
CO2: 25 mmol/L (ref 19–32)
Chloride: 103 mmol/L (ref 96–112)
Creatinine, Ser: 0.74 mg/dL (ref 0.50–1.35)
GFR calc Af Amer: >90 mL/min (ref >90)
GFR calc non Af Amer: 80 mL/min — ABNORMAL LOW (ref >90)
Glucose, Bld: 115 mg/dL — ABNORMAL HIGH (ref 70–99)
Potassium: 4.1 mmol/L (ref 3.5–5.1)
Sodium: 135 mmol/L (ref 135–145)

## 2015-02-06 LAB — PREPARE RBC (CROSSMATCH)

## 2015-02-06 MED ORDER — ACETAMINOPHEN 325 MG PO TABS: 650.0000 mg | ORAL_TABLET | Freq: Four times a day (QID) | ORAL | Status: AC | PRN

## 2015-02-06 MED ORDER — SODIUM CHLORIDE 0.9 % IV SOLN
Freq: Once | INTRAVENOUS | Status: AC
  Administered 2015-02-06: 13:00:00 via INTRAVENOUS

## 2015-02-06 MED ORDER — OXYCODONE HCL 5 MG PO TABS: 5.0000 mg | ORAL_TABLET | ORAL | Status: AC | PRN

## 2015-02-06 MED ORDER — FAMOTIDINE 20 MG PO TABS
20.0000 mg | ORAL_TABLET | Freq: Two times a day (BID) | ORAL | Status: DC
  Administered 2015-02-06 (×2): 20 mg via ORAL
  Filled 2015-02-06 (×4): qty 1

## 2015-02-06 MED ORDER — RIVAROXABAN 10 MG PO TABS: 10.0000 mg | ORAL_TABLET | ORAL | Status: AC

## 2015-02-06 NOTE — Progress Notes (Signed)
Patient ID: Aaron Alvarez, male   DOB: [birth year], 81 y.o.   MRN: [id number] PATIENT ID: Aaron Alvarez        MRN:  [id number]          DOB/AGE: 04/23/1925 / 81 y.o.    [name], MD   Biagio Borg, PA-C [address] [place], [place]  [address]                             [phone]   PROGRESS NOTE  Subjective:  negative for Chest Pain  negative for Shortness of Breath  positive for Nausea no vomiting   negative for Calf Pain    Tolerating Diet: yes         Patient reports pain as mild and moderate.     More painful today but tolerating it well  Objective: Vital signs in last 24 hours:   Patient Vitals for the past 24 hrs:  BP Temp Temp src Pulse Resp SpO2  02/06/15 1050 (!) 120/58 mmHg 98.6 F (37 C) Oral 89 16 100 %  02/06/15 1007 (!) 112/54 mmHg 98.1 F (36.7 C) Oral 96 20 98 %  02/06/15 0841 - - - - 18 -  02/06/15 0500 (!) 117/45 mmHg 98 F (36.7 C) Oral 99 20 100 %  02/05/15 1954 (!) 127/49 mmHg 98.6 F (37 C) Oral 85 18 98 %  02/05/15 1615 (!) 120/49 mmHg 98 F (36.7 C) Oral 86 15 98 %  02/05/15 1530 (!) 115/55 mmHg 98.6 F (37 C) Oral 88 16 97 %      Intake/Output from previous day:   02/10 0701 - 02/11 0700 In: 1085 [P.O.:720] Out: 1790 [Urine:1790]   Intake/Output this shift:   02/11 0701 - 02/11 1900 In: 270 [P.O.:240] Out: -    Intake/Output      02/10 0701 - 02/11 0700 02/11 0701 - 02/12 0700   P.O. 720 240   I.V. (mL/kg)     Blood 365 30   IV Piggyback     Total Intake(mL/kg) 1085 (12.9) 270 (3.2)   Urine (mL/kg/hr) 1790 (0.9)    Emesis/NG output     Blood     Total Output 1790     Net -705 +270           LABORATORY DATA:  Recent Labs  02/05/15 0709 02/05/15 2135 02/06/15 0705  WBC 5.3 6.8 5.9  HGB 8.6* 9.4* 8.8*  HCT 25.2* 27.1* 25.6*  PLT 148* 156 125*    Recent Labs  02/05/15 0709 02/06/15 0705  NA 134* 135  K 3.8 4.1  CL 102 103  CO2 28 25  BUN 16 12  CREATININE 0.90 0.74  GLUCOSE 118* 115*    CALCIUM 8.6 8.6   Lab Results  Component Value Date   INR 1.02 01/27/2015    Recent Radiographic Studies :  Dg Chest 2 View  01/27/2015   CLINICAL DATA:  Smoker.  Preop chest x-ray  EXAM: CHEST  2 VIEW  COMPARISON:  10/19/2005.  FINDINGS: Mediastinum and hilar structures normal. Lungs are clear. Heart size normal. No pleural effusion or pneumothorax. Deformity noted of the left posterior ribs cyst with prior healed fracture. Degenerative changes thoracic spine . Postsurgical changes right shoulder.  IMPRESSION: No acute cardiopulmonary disease.   Electronically Signed   By: Marcello Moores  Register   On: 01/27/2015 10:46   Dg Pelvis 1-2 Views  02/04/2015   CLINICAL  DATA:  81 year old male status post left total hip arthroplasty  EXAM: PELVIS - 1-2 VIEW  COMPARISON:  None.  FINDINGS: Single frontal radiograph of the pelvis demonstrates surgical changes of left total hip arthroplasty. No evidence of periprosthetic fracture or other acute complication. Expected subcutaneous emphysema. Moderate right hip degenerative osteoarthritis with prominent lateral acetabular osteophyte.  IMPRESSION: Left total hip arthroplasty without evidence of acute complication.   Electronically Signed   By: Jacqulynn Cadet M.D.   On: 02/04/2015 16:08     Examination:  General appearance: alert, cooperative, mild distress and moderate distress  Wound Exam: clean, dry, intact   Drainage:  None: wound tissue dry  Motor Exam: EHL, FHL, Anterior Tibial and Posterior Tibial Intact  Sensory Exam: Superficial Peroneal, Deep Peroneal and Tibial normal  Vascular Exam: Left dorsalis pedis artery has 1+ (weak) pulse  Assessment:    2 Days Post-Op  Procedure(s) (LRB): LEFT TOTAL HIP ARTHROPLASTY (Left)  ADDITIONAL DIAGNOSIS:  Active Problems:   S/P total hip arthroplasty  Acute Blood Loss Anemia   Plan: Physical Therapy as ordered Weight Bearing as Tolerated (WBAT)  DVT Prophylaxis:  Xarelto, Foot Pumps and TED  hose  DISCHARGE PLAN: Skilled Nursing Facility/Rehab tomorrow - [facility]  DISCHARGE NEEDS: HHPT, Walker and 3-in-1 comode seat  Gave 1 unit PRBC's yesterday because of decreased BP and increased heart rate.   Giving another unit today Plan D/C tomorrow to [facility]  02/06/2015 11:47 AM

## 2015-02-06 NOTE — Progress Notes (Signed)
Physical Therapy Treatment Patient Details Name: Aaron Alvarez MRN: [id number] DOB: 1925/06/15 Today's Date: 02/06/2015    History of Present Illness Pt is a very active 81 y.o. male s/p Lt THA.     PT Comments    Pt slowly progressing with therapy. Pt limited by nausea. Pt denied any dizziness or lightheadedness with mobility at this time. Pt with limited Lt knee flexion due to previous surgery and is guarded due to pain. Will cont to follow per POC.  Follow Up Recommendations  SNF;Supervision/Assistance - 24 hour     Equipment Recommendations  3in1 (PT)    Recommendations for Other Services       Precautions / Restrictions Precautions Precautions: Posterior Hip Precaution Comments: reviewed handout; pt able to independently recall 2/3  Required Braces or Orthoses: Knee Immobilizer - Left Knee Immobilizer - Left: Other (comment) Restrictions Weight Bearing Restrictions: Yes LLE Weight Bearing: Weight bearing as tolerated    Mobility  Bed Mobility Overal bed mobility: Needs Assistance Bed Mobility: Supine to Sit     Supine to sit: Min assist;HOB elevated     General bed mobility comments: given sheet to use as legt lift; min (A) to advance and control off EOB  Transfers Overall transfer level: Needs assistance Equipment used: Rolling walker (2 wheeled) Transfers: Sit to/from Stand Sit to Stand: Min assist         General transfer comment: cues for hand placement; min (A) to balance   Ambulation/Gait Ambulation/Gait assistance: Min guard Ambulation Distance (Feet): 60 Feet Assistive device: Rolling walker (2 wheeled) Gait Pattern/deviations: Step-to pattern;Decreased step length - right;Decreased stance time - left;Antalgic;Narrow base of support;Trunk flexed Gait velocity: decr Gait velocity interpretation: Below normal speed for age/gender General Gait Details: RW adjusted; pt c/o pain in shoulders; cues for step through technique; pt limited by nausea     Stairs            Wheelchair Mobility    Modified Rankin (Stroke Patients Only)       Balance Overall balance assessment: Needs assistance Sitting-balance support: Feet supported;No upper extremity supported Sitting balance-Leahy Scale: Good     Standing balance support: During functional activity;Bilateral upper extremity supported Standing balance-Leahy Scale: Poor Standing balance comment: RW to balance                     Cognition Arousal/Alertness: Awake/alert Behavior During Therapy: WFL for tasks assessed/performed Overall Cognitive Status: Within Functional Limits for tasks assessed       Memory: Decreased recall of precautions              Exercises Total Joint Exercises Ankle Circles/Pumps: AROM;Both;15 reps Quad Sets: AROM;Left;10 reps Gluteal Sets: 10 reps Heel Slides: AAROM;Left;5 reps;Seated Hip ABduction/ADduction: AAROM;Left;Seated;10 reps Long Arc Quad: AROM;Left;Seated;15 reps Goniometric ROM: limited knee flexion to 55 degrees     General Comments        Pertinent Vitals/Pain Pain Assessment: 0-10 Pain Score: 3  Pain Location: Lt hip with lifting  Pain Descriptors / Indicators: Aching Pain Intervention(s): Monitored during session;Repositioned;Patient requesting pain meds-RN notified    Home Living                      Prior Function            PT Goals (current goals can now be found in the care plan section) Acute Rehab PT Goals Patient Stated Goal: to be able to pee and go to rehab PT Goal  Formulation: With patient Time For Goal Achievement: 02/12/15 Potential to Achieve Goals: Good Progress towards PT goals: Progressing toward goals    Frequency  7X/week    PT Plan Current plan remains appropriate    Co-evaluation             End of Session Equipment Utilized During Treatment: Gait belt Activity Tolerance: Patient limited by fatigue;Other (comment) (limited by nausea ) Patient left:  in chair;with call bell/phone within reach     Time: 0757-0823 PT Time Calculation (min) (ACUTE ONLY): 26 min  Charges:  $Gait Training: 8-22 mins $Therapeutic Exercise: 8-22 mins                    G Codes:      [name], [place]  [phone] 02/06/2015, 9:43 AM

## 2015-02-06 NOTE — Progress Notes (Signed)
Uncomfortable  

## 2015-02-06 NOTE — Progress Notes (Signed)
Physical Therapy Treatment Patient Details Name: Aaron Alvarez MRN: [id number] DOB: 11-26-1925 Today's Date: 02/06/2015    History of Present Illness Pt is a very active 81 y.o. male s/p Lt THA.     PT Comments    Pt progressing mobility. Continues to require min guard to steady. Wife is unable to provide physical (A) at home. Cont to plan for D/C to [facility] for cont therapy.   Follow Up Recommendations  SNF;Supervision/Assistance - 24 hour     Equipment Recommendations  3in1 (PT)    Recommendations for Other Services       Precautions / Restrictions Precautions Precautions: Posterior Hip Precaution Comments: pt able to independently recall 3/3  Required Braces or Orthoses: Knee Immobilizer - Left Knee Immobilizer - Left: Other (comment) Restrictions Weight Bearing Restrictions: Yes LLE Weight Bearing: Weight bearing as tolerated    Mobility  Bed Mobility Overal bed mobility: Needs Assistance Bed Mobility: Sit to Supine       Sit to supine: Min assist   General bed mobility comments: educated on leg lift using sheet but cont to require (A) bringing Lt LE into supine position  Transfers Overall transfer level: Needs assistance Equipment used: Rolling walker (2 wheeled) Transfers: Sit to/from Stand Sit to Stand: Min guard         General transfer comment: min guard when transferring to stand to steady; cues for hand placement   Ambulation/Gait Ambulation/Gait assistance: Min guard Ambulation Distance (Feet): 120 Feet Assistive device: Rolling walker (2 wheeled) Gait Pattern/deviations: Step-through pattern;Antalgic;Decreased stride length;Narrow base of support Gait velocity: decr Gait velocity interpretation: Below normal speed for age/gender General Gait Details: cues for widen BOS and heel strike on Lt LE; min guard to steady and cues for posterior hip precautions with directional changes    Stairs            Wheelchair Mobility     Modified Rankin (Stroke Patients Only)       Balance Overall balance assessment: Needs assistance Sitting-balance support: Feet supported;No upper extremity supported Sitting balance-Leahy Scale: Good     Standing balance support: During functional activity;Bilateral upper extremity supported;Single extremity supported Standing balance-Leahy Scale: Poor Standing balance comment: UE support at all times in bathroom during ADLs and while upright                     Cognition Arousal/Alertness: Awake/alert Behavior During Therapy: WFL for tasks assessed/performed Overall Cognitive Status: Within Functional Limits for tasks assessed                      Exercises Total Joint Exercises Ankle Circles/Pumps: AROM;Both;15 reps Quad Sets: AROM;Left;10 reps Gluteal Sets: 10 reps Straight Leg Raises: AAROM;Left;5 reps;Supine Long Arc Quad: AROM;Left;Seated;15 reps    General Comments        Pertinent Vitals/Pain Pain Assessment: 0-10 Pain Score: 3  Pain Location: Lt hip with exercises  Pain Descriptors / Indicators: Sore Pain Intervention(s): Monitored during session;Premedicated before session;Repositioned    Home Living                      Prior Function            PT Goals (current goals can now be found in the care plan section) Acute Rehab PT Goals Patient Stated Goal: to get back on the golf course PT Goal Formulation: With patient Time For Goal Achievement: 02/12/15 Potential to Achieve Goals: Good Progress towards PT  goals: Progressing toward goals    Frequency  7X/week    PT Plan Current plan remains appropriate    Co-evaluation             End of Session Equipment Utilized During Treatment: Gait belt Activity Tolerance: Patient tolerated treatment well Patient left: in bed;with call bell/phone within reach     Time: 1347-1415 PT Time Calculation (min) (ACUTE ONLY): 28 min  Charges:  $Gait Training: 8-22  mins $Therapeutic Exercise: 8-22 mins                    G CodesGustavus Bryant, [place]  [phone] 02/06/2015, 3:43 PM
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
## 2015-02-07 DIAGNOSIS — Z471 Aftercare following joint replacement surgery: Secondary | ICD-10-CM | POA: Diagnosis not present

## 2015-02-07 DIAGNOSIS — Z96642 Presence of left artificial hip joint: Secondary | ICD-10-CM | POA: Diagnosis not present

## 2015-02-07 DIAGNOSIS — M6281 Muscle weakness (generalized): Secondary | ICD-10-CM | POA: Diagnosis not present

## 2015-02-07 DIAGNOSIS — M1652 Unilateral post-traumatic osteoarthritis, left hip: Secondary | ICD-10-CM | POA: Diagnosis not present

## 2015-02-07 DIAGNOSIS — N400 Enlarged prostate without lower urinary tract symptoms: Secondary | ICD-10-CM | POA: Diagnosis not present

## 2015-02-07 DIAGNOSIS — E785 Hyperlipidemia, unspecified: Secondary | ICD-10-CM | POA: Diagnosis not present

## 2015-02-07 DIAGNOSIS — D62 Acute posthemorrhagic anemia: Secondary | ICD-10-CM | POA: Diagnosis not present

## 2015-02-07 DIAGNOSIS — R2681 Unsteadiness on feet: Secondary | ICD-10-CM | POA: Diagnosis not present

## 2015-02-07 DIAGNOSIS — R278 Other lack of coordination: Secondary | ICD-10-CM | POA: Diagnosis not present

## 2015-02-07 DIAGNOSIS — Z9660 Presence of unspecified orthopedic joint implant: Secondary | ICD-10-CM | POA: Diagnosis not present

## 2015-02-07 LAB — TYPE AND SCREEN
ABO/RH(D): O POS
Antibody Screen: NEGATIVE
UNIT DIVISION: 0
Unit division: 0

## 2015-02-07 LAB — BASIC METABOLIC PANEL
ANION GAP: 5 (ref 5–15)
BUN: 11 mg/dL (ref 6–23)
CO2: 27 mmol/L (ref 19–32)
CREATININE: 0.7 mg/dL (ref 0.50–1.35)
Calcium: 8.9 mg/dL (ref 8.4–10.5)
Chloride: 104 mmol/L (ref 96–112)
GFR calc Af Amer: >90 mL/min (ref >90)
GFR, EST NON AFRICAN AMERICAN: 82 mL/min — AB (ref >90)
Glucose, Bld: 94 mg/dL (ref 70–99)
Potassium: 4.2 mmol/L (ref 3.5–5.1)
SODIUM: 136 mmol/L (ref 135–145)

## 2015-02-07 LAB — CBC
HEMATOCRIT: 27.7 % — AB (ref 39.0–52.0)
HEMOGLOBIN: 9.3 g/dL — AB (ref 13.0–17.0)
MCH: 29.7 pg (ref 26.0–34.0)
MCHC: 33.6 g/dL (ref 30.0–36.0)
MCV: 88.5 fL (ref 78.0–100.0)
Platelets: 134 10*3/uL — ABNORMAL LOW (ref 150–400)
RBC: 3.13 MIL/uL — ABNORMAL LOW (ref 4.22–5.81)
RDW: 12.8 % (ref 11.5–15.5)
WBC: 5.5 10*3/uL (ref 4.0–10.5)

## 2015-02-07 NOTE — Clinical Social Work Note (Signed)
Patient to be discharged to [facility]. Patient updated at bedside.  Facility: [place] Report number: [phone] Transportation: Patient's son to provide transportation at [place], Latanya Presser ([phone]) Licensed Clinical Social Worker Orthopedics [phone]) and Surgical [phone])

## 2015-02-07 NOTE — Progress Notes (Signed)
Physical Therapy Treatment Patient Details Name: Aaron Alvarez MRN: [id number] DOB: Sep 18, 1925 Today's Date: 02/07/2015    History of Present Illness Pt is a very active 81 y.o. male s/p Lt THA.     PT Comments    Pt continues to be very motivated to participate in therapy. Pt requiring cues to apply posterior hip precautions with movements. Hopeful to D/C to [facility] today.  Reviewed car transfer technique.   Follow Up Recommendations  SNF;Supervision/Assistance - 24 hour     Equipment Recommendations  3in1 (PT)    Recommendations for Other Services       Precautions / Restrictions Precautions Precautions: Posterior Hip Precaution Comments: pt with difficulty applying precautions; able to verbalize 2/3 precautions Restrictions Weight Bearing Restrictions: Yes LLE Weight Bearing: Weight bearing as tolerated    Mobility  Bed Mobility               General bed mobility comments: up in chair  Transfers Overall transfer level: Needs assistance Equipment used: Rolling walker (2 wheeled) Transfers: Sit to/from Stand Sit to Stand: Min guard         General transfer comment: cues for technique  Ambulation/Gait Ambulation/Gait assistance: Min guard Ambulation Distance (Feet): 200 Feet Assistive device: Rolling walker (2 wheeled) Gait Pattern/deviations: Step-through pattern;Decreased stance time - left;Decreased step length - right (decr knee flexion on Lt Le ) Gait velocity: decr Gait velocity interpretation: Below normal speed for age/gender General Gait Details: pt circumducting Lt LE at times to clear Lt foot due to decr knee flexion; cues for upright posture and to relax shoulders   Stairs            Wheelchair Mobility    Modified Rankin (Stroke Patients Only)       Balance Overall balance assessment: Needs assistance Sitting-balance support: Feet supported;No upper extremity supported Sitting balance-Leahy Scale: Good     Standing  balance support: During functional activity;No upper extremity supported;Single extremity supported Standing balance-Leahy Scale: Fair Standing balance comment: stood brief period at toilet to pull up pants; min guard to supervision for safety                    Cognition Arousal/Alertness: Awake/alert Behavior During Therapy: WFL for tasks assessed/performed Overall Cognitive Status: Within Functional Limits for tasks assessed       Memory: Decreased recall of precautions              Exercises Total Joint Exercises Ankle Circles/Pumps: AROM;Both;15 reps Gluteal Sets: 10 reps Heel Slides: AAROM;Left;Seated;10 reps Hip ABduction/ADduction: AAROM;Left;Seated;10 reps Long Arc Quad: AROM;Left;Seated;15 reps Knee Flexion: AAROM;Left;5 reps;Limitations Knee Flexion Limitations: decr ROM     General Comments        Pertinent Vitals/Pain Pain Assessment: 0-10 Pain Score: 2  Pain Location: Lt hip Pain Descriptors / Indicators: Constant;Spasm Pain Intervention(s): Monitored during session;Repositioned;Premedicated before session    Home Living                      Prior Function            PT Goals (current goals can now be found in the care plan section) Acute Rehab PT Goals Patient Stated Goal: to go to rehab today PT Goal Formulation: With patient Time For Goal Achievement: 02/12/15 Potential to Achieve Goals: Good Progress towards PT goals: Progressing toward goals    Frequency  7X/week    PT Plan Current plan remains appropriate    Co-evaluation  End of Session Equipment Utilized During Treatment: Gait belt Activity Tolerance: Patient tolerated treatment well Patient left: in chair;with call bell/phone within reach     Time: 1018-1046 PT Time Calculation (min) (ACUTE ONLY): 28 min  Charges:  $Gait Training: 8-22 mins $Therapeutic Exercise: 8-22 mins                    G CodesGustavus Bryant, [place] 02/07/2015, 11:07 AM

## 2015-02-11 ENCOUNTER — Non-Acute Institutional Stay (SKILLED_NURSING_FACILITY): Payer: Medicare Other | Admitting: Adult Health

## 2015-02-11 DIAGNOSIS — M1652 Unilateral post-traumatic osteoarthritis, left hip: Secondary | ICD-10-CM | POA: Diagnosis not present

## 2015-02-11 DIAGNOSIS — D62 Acute posthemorrhagic anemia: Secondary | ICD-10-CM | POA: Diagnosis not present

## 2015-02-11 DIAGNOSIS — N400 Enlarged prostate without lower urinary tract symptoms: Secondary | ICD-10-CM

## 2015-02-11 DIAGNOSIS — Z9660 Presence of unspecified orthopedic joint implant: Secondary | ICD-10-CM

## 2015-02-11 DIAGNOSIS — Z96649 Presence of unspecified artificial hip joint: Secondary | ICD-10-CM

## 2015-02-12 ENCOUNTER — Encounter: Payer: Self-pay | Admitting: Adult Health

## 2015-02-12 DIAGNOSIS — N400 Enlarged prostate without lower urinary tract symptoms: Secondary | ICD-10-CM | POA: Insufficient documentation
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
## 2015-02-13 ENCOUNTER — Non-Acute Institutional Stay (SKILLED_NURSING_FACILITY): Payer: Medicare Other | Admitting: Adult Health

## 2015-02-13 ENCOUNTER — Encounter: Payer: Self-pay | Admitting: Adult Health

## 2015-02-13 DIAGNOSIS — Z96649 Presence of unspecified artificial hip joint: Secondary | ICD-10-CM

## 2015-02-13 DIAGNOSIS — D62 Acute posthemorrhagic anemia: Secondary | ICD-10-CM | POA: Diagnosis not present

## 2015-02-13 DIAGNOSIS — M1652 Unilateral post-traumatic osteoarthritis, left hip: Secondary | ICD-10-CM | POA: Diagnosis not present

## 2015-02-13 DIAGNOSIS — Z9660 Presence of unspecified orthopedic joint implant: Secondary | ICD-10-CM | POA: Diagnosis not present

## 2015-02-13 DIAGNOSIS — N400 Enlarged prostate without lower urinary tract symptoms: Secondary | ICD-10-CM

## 2015-02-13 LAB — POCT I-STAT 4, (NA,K, GLUC, HGB,HCT)
GLUCOSE: 120 mg/dL — AB (ref 70–99)
Glucose, Bld: 126 mg/dL — ABNORMAL HIGH (ref 70–99)
HCT: 33 % — ABNORMAL LOW (ref 39.0–52.0)
HEMATOCRIT: 28 % — AB (ref 39.0–52.0)
Hemoglobin: 11.2 g/dL — ABNORMAL LOW (ref 13.0–17.0)
Hemoglobin: 9.5 g/dL — ABNORMAL LOW (ref 13.0–17.0)
POTASSIUM: 3.9 mmol/L (ref 3.5–5.1)
Potassium: 4 mmol/L (ref 3.5–5.1)
Sodium: 138 mmol/L (ref 135–145)
Sodium: 140 mmol/L (ref 135–145)
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
## 2015-02-14 DIAGNOSIS — Z96652 Presence of left artificial knee joint: Secondary | ICD-10-CM | POA: Diagnosis not present

## 2015-02-14 DIAGNOSIS — Z471 Aftercare following joint replacement surgery: Secondary | ICD-10-CM | POA: Diagnosis not present

## 2015-02-14 DIAGNOSIS — Z96642 Presence of left artificial hip joint: Secondary | ICD-10-CM | POA: Diagnosis not present

## 2015-02-14 DIAGNOSIS — H9193 Unspecified hearing loss, bilateral: Secondary | ICD-10-CM | POA: Diagnosis not present

## 2015-02-14 DIAGNOSIS — R269 Unspecified abnormalities of gait and mobility: Secondary | ICD-10-CM | POA: Diagnosis not present

## 2015-02-14 DIAGNOSIS — H532 Diplopia: Secondary | ICD-10-CM | POA: Diagnosis not present

## 2015-02-17 DIAGNOSIS — H532 Diplopia: Secondary | ICD-10-CM | POA: Diagnosis not present

## 2015-02-17 DIAGNOSIS — Z96642 Presence of left artificial hip joint: Secondary | ICD-10-CM | POA: Diagnosis not present

## 2015-02-17 DIAGNOSIS — R269 Unspecified abnormalities of gait and mobility: Secondary | ICD-10-CM | POA: Diagnosis not present

## 2015-02-17 DIAGNOSIS — H9193 Unspecified hearing loss, bilateral: Secondary | ICD-10-CM | POA: Diagnosis not present

## 2015-02-17 DIAGNOSIS — Z471 Aftercare following joint replacement surgery: Secondary | ICD-10-CM | POA: Diagnosis not present

## 2015-02-17 DIAGNOSIS — Z96652 Presence of left artificial knee joint: Secondary | ICD-10-CM | POA: Diagnosis not present

## 2015-02-17 DIAGNOSIS — M1612 Unilateral primary osteoarthritis, left hip: Secondary | ICD-10-CM | POA: Diagnosis not present

## 2015-02-19 DIAGNOSIS — H532 Diplopia: Secondary | ICD-10-CM | POA: Diagnosis not present

## 2015-02-19 DIAGNOSIS — Z96652 Presence of left artificial knee joint: Secondary | ICD-10-CM | POA: Diagnosis not present

## 2015-02-19 DIAGNOSIS — H9193 Unspecified hearing loss, bilateral: Secondary | ICD-10-CM | POA: Diagnosis not present

## 2015-02-19 DIAGNOSIS — R269 Unspecified abnormalities of gait and mobility: Secondary | ICD-10-CM | POA: Diagnosis not present

## 2015-02-19 DIAGNOSIS — Z471 Aftercare following joint replacement surgery: Secondary | ICD-10-CM | POA: Diagnosis not present

## 2015-02-19 DIAGNOSIS — Z96642 Presence of left artificial hip joint: Secondary | ICD-10-CM | POA: Diagnosis not present

## 2015-02-21 DIAGNOSIS — Z471 Aftercare following joint replacement surgery: Secondary | ICD-10-CM | POA: Diagnosis not present

## 2015-02-21 DIAGNOSIS — Z96652 Presence of left artificial knee joint: Secondary | ICD-10-CM | POA: Diagnosis not present

## 2015-02-21 DIAGNOSIS — R269 Unspecified abnormalities of gait and mobility: Secondary | ICD-10-CM | POA: Diagnosis not present

## 2015-02-21 DIAGNOSIS — H532 Diplopia: Secondary | ICD-10-CM | POA: Diagnosis not present

## 2015-02-21 DIAGNOSIS — Z96642 Presence of left artificial hip joint: Secondary | ICD-10-CM | POA: Diagnosis not present

## 2015-02-21 DIAGNOSIS — H9193 Unspecified hearing loss, bilateral: Secondary | ICD-10-CM | POA: Diagnosis not present

## 2015-02-21 IMAGING — CR DG CHEST 2V
2 series · 2 of 2 positions shown · non-contrast
Comparison: 10/19/2005.

CLINICAL DATA: Smoker.  Preop chest x-ray

EXAM:
CHEST  2 VIEW

[w chest pa]
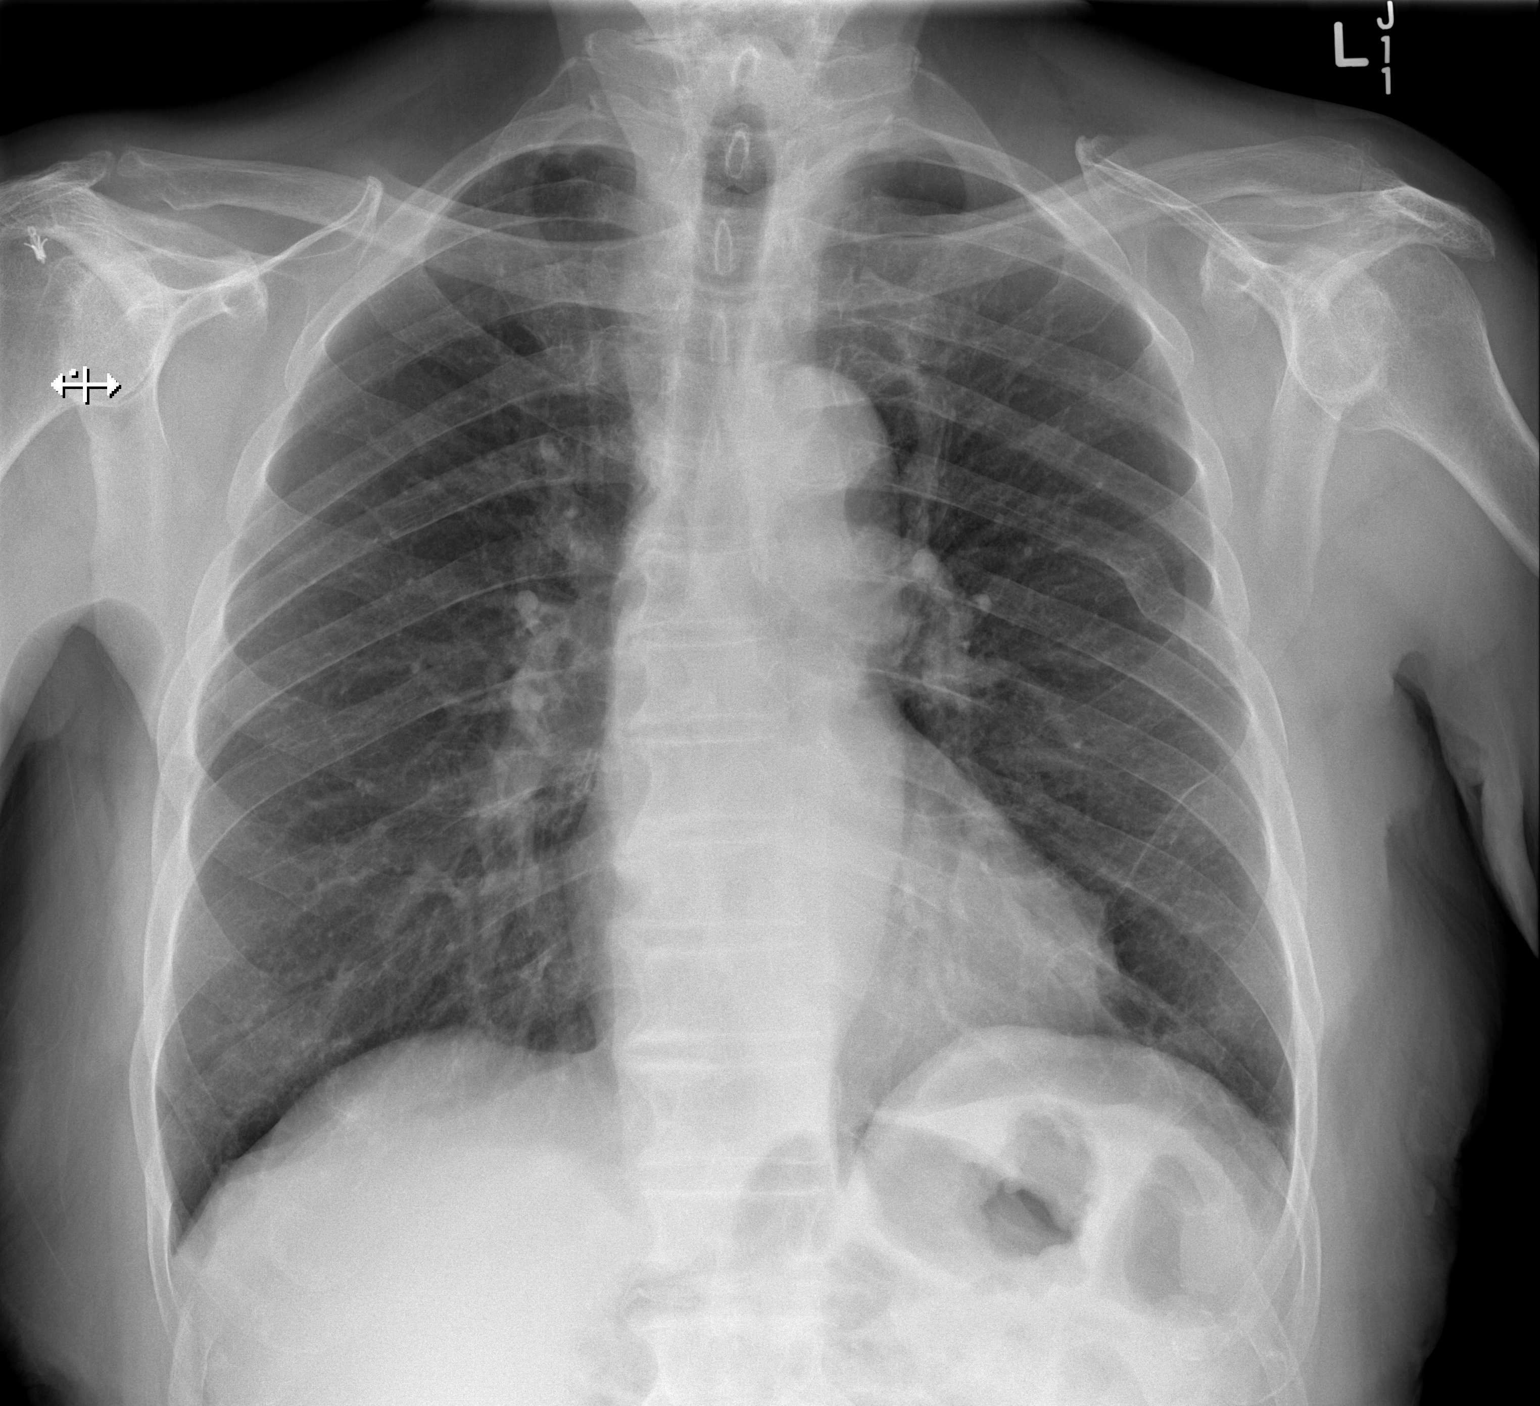

[w chest lat]
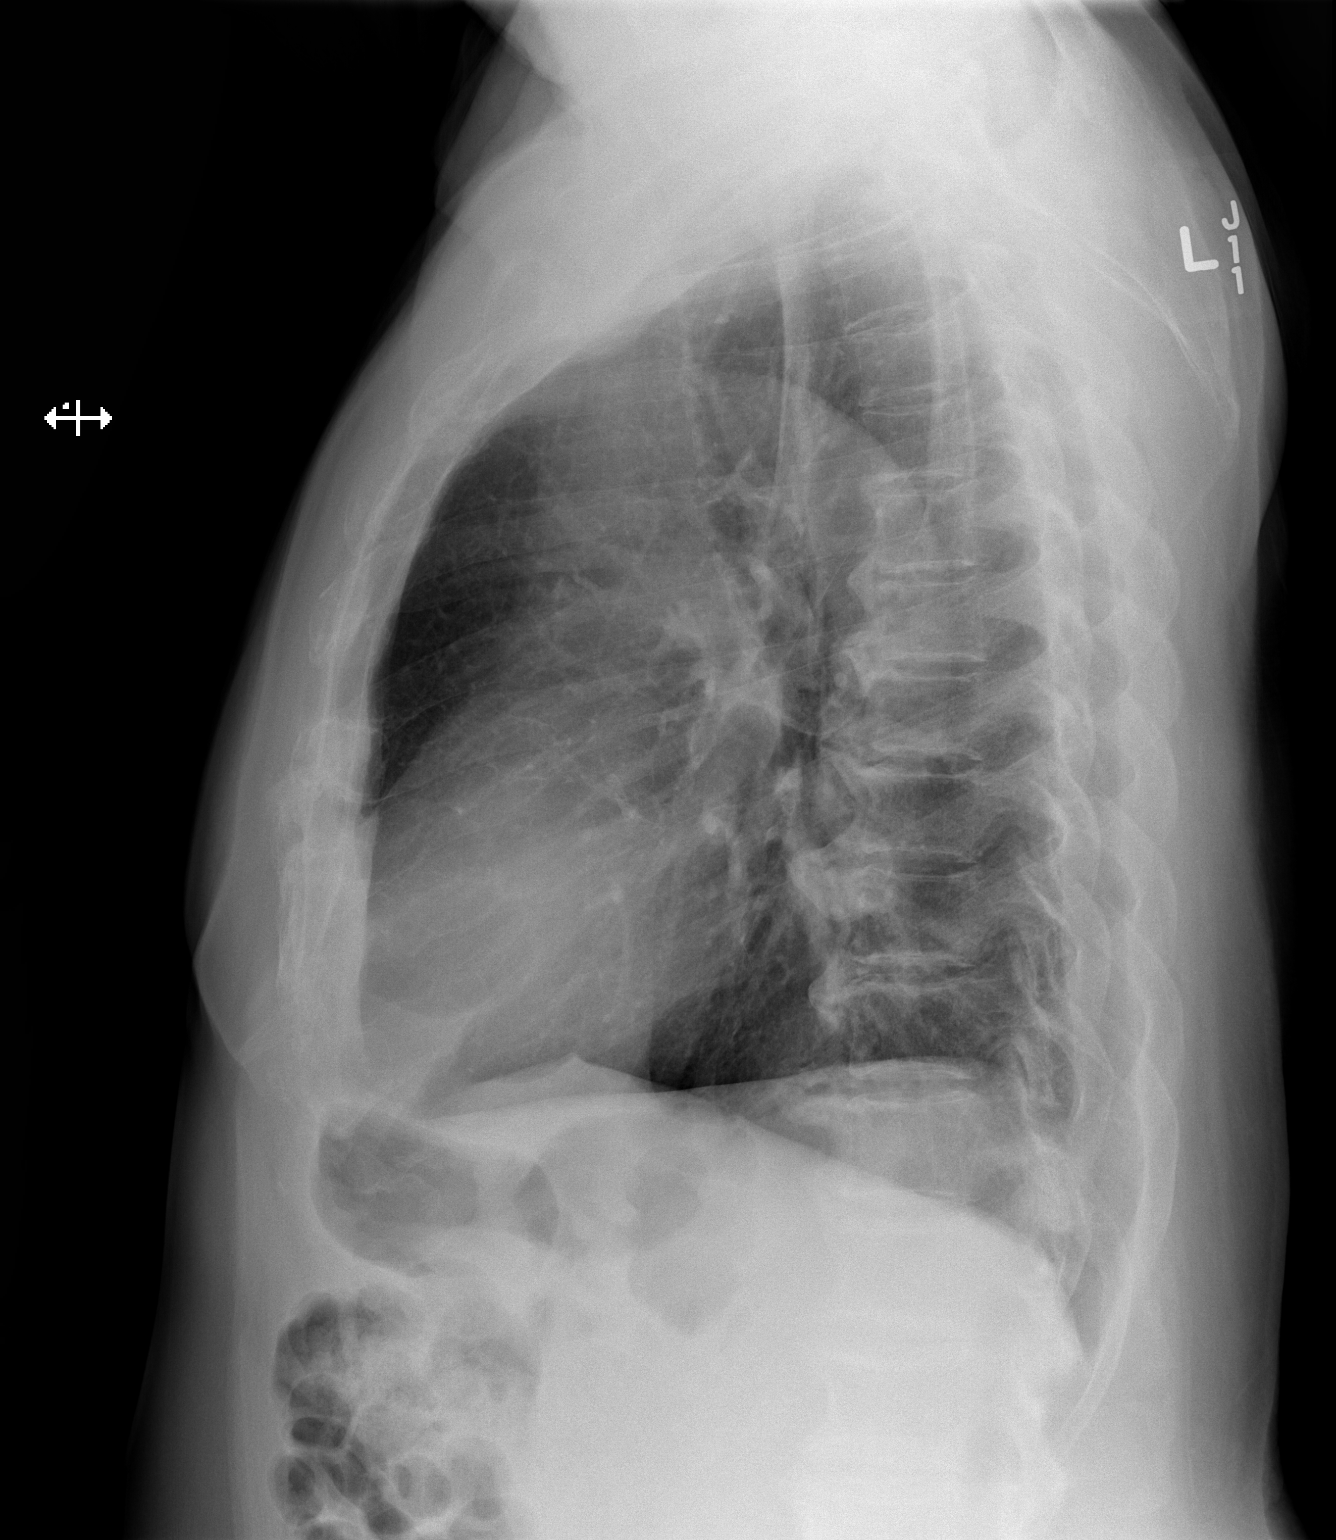

[2 of 2 positions shown; findings below may reference images not displayed]

FINDINGS: Mediastinum and hilar structures normal. Lungs are clear. Heart size
normal. No pleural effusion or pneumothorax. Deformity noted of the
left posterior ribs cyst with prior healed fracture. Degenerative
changes thoracic spine . Postsurgical changes right shoulder.
IMPRESSION: No acute cardiopulmonary disease.

## 2015-02-24 DIAGNOSIS — Z96642 Presence of left artificial hip joint: Secondary | ICD-10-CM | POA: Diagnosis not present

## 2015-02-24 DIAGNOSIS — Z96652 Presence of left artificial knee joint: Secondary | ICD-10-CM | POA: Diagnosis not present

## 2015-02-24 DIAGNOSIS — H532 Diplopia: Secondary | ICD-10-CM | POA: Diagnosis not present

## 2015-02-24 DIAGNOSIS — H9193 Unspecified hearing loss, bilateral: Secondary | ICD-10-CM | POA: Diagnosis not present

## 2015-02-24 DIAGNOSIS — Z471 Aftercare following joint replacement surgery: Secondary | ICD-10-CM | POA: Diagnosis not present

## 2015-02-24 DIAGNOSIS — R269 Unspecified abnormalities of gait and mobility: Secondary | ICD-10-CM | POA: Diagnosis not present

## 2015-02-26 DIAGNOSIS — Z471 Aftercare following joint replacement surgery: Secondary | ICD-10-CM | POA: Diagnosis not present

## 2015-02-26 DIAGNOSIS — H9193 Unspecified hearing loss, bilateral: Secondary | ICD-10-CM | POA: Diagnosis not present

## 2015-02-26 DIAGNOSIS — Z96642 Presence of left artificial hip joint: Secondary | ICD-10-CM | POA: Diagnosis not present

## 2015-02-26 DIAGNOSIS — H532 Diplopia: Secondary | ICD-10-CM | POA: Diagnosis not present

## 2015-02-26 DIAGNOSIS — R269 Unspecified abnormalities of gait and mobility: Secondary | ICD-10-CM | POA: Diagnosis not present

## 2015-02-26 DIAGNOSIS — Z96652 Presence of left artificial knee joint: Secondary | ICD-10-CM | POA: Diagnosis not present

## 2015-02-28 DIAGNOSIS — Z96652 Presence of left artificial knee joint: Secondary | ICD-10-CM | POA: Diagnosis not present

## 2015-02-28 DIAGNOSIS — H532 Diplopia: Secondary | ICD-10-CM | POA: Diagnosis not present

## 2015-02-28 DIAGNOSIS — R269 Unspecified abnormalities of gait and mobility: Secondary | ICD-10-CM | POA: Diagnosis not present

## 2015-02-28 DIAGNOSIS — Z471 Aftercare following joint replacement surgery: Secondary | ICD-10-CM | POA: Diagnosis not present

## 2015-02-28 DIAGNOSIS — Z96642 Presence of left artificial hip joint: Secondary | ICD-10-CM | POA: Diagnosis not present

## 2015-02-28 DIAGNOSIS — H9193 Unspecified hearing loss, bilateral: Secondary | ICD-10-CM | POA: Diagnosis not present

## 2015-03-01 IMAGING — CR DG PELVIS 1-2V
1 series · 1 of 1 positions shown · non-contrast
Comparison: None.

CLINICAL DATA: 89-year-old male status post left total hip
arthroplasty

EXAM:
PELVIS - 1-2 VIEW

[[person_name] view]
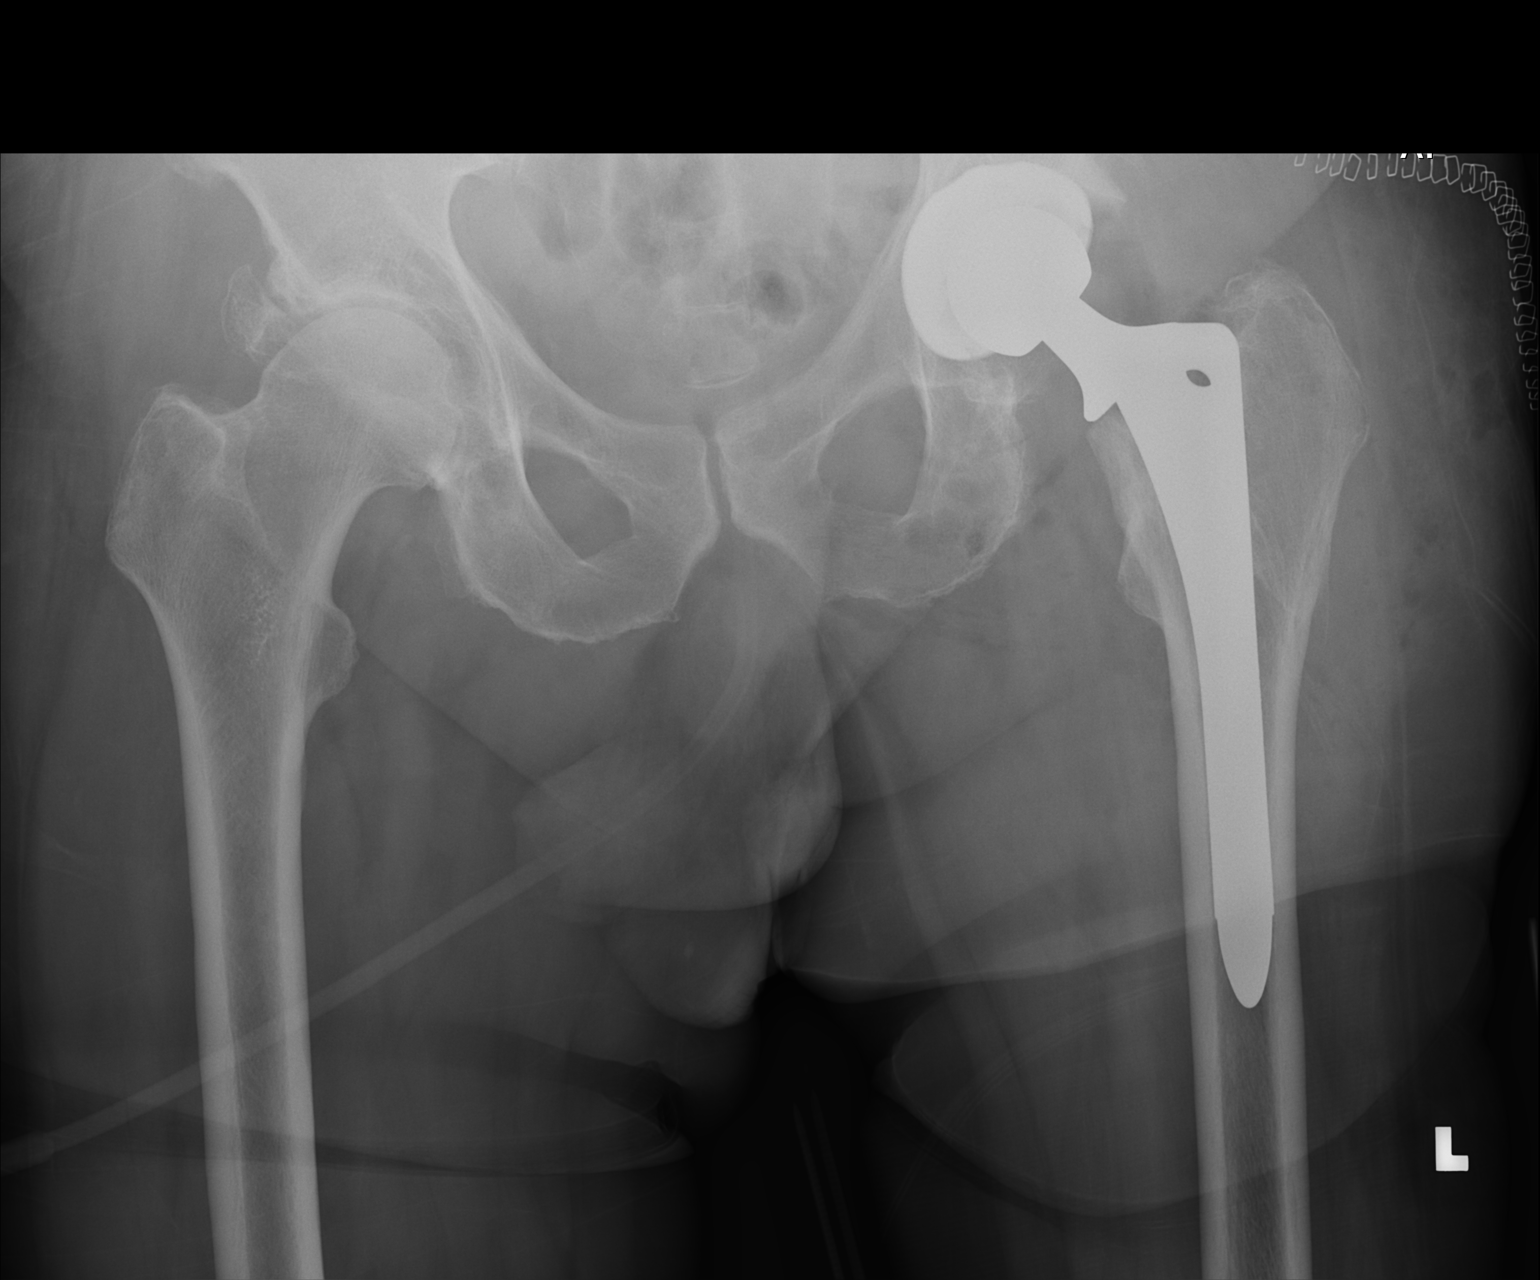

[1 of 1 positions shown; findings below may reference images not displayed]

FINDINGS: Single frontal radiograph of the pelvis demonstrates surgical
changes of left total hip arthroplasty. No evidence of
periprosthetic fracture or other acute complication. Expected
subcutaneous emphysema. Moderate right hip degenerative
osteoarthritis with prominent lateral acetabular osteophyte.
IMPRESSION: Left total hip arthroplasty without evidence of acute complication.

## 2015-04-11 DIAGNOSIS — S61012A Laceration without foreign body of left thumb without damage to nail, initial encounter: Secondary | ICD-10-CM | POA: Diagnosis not present

## 2015-05-14 DIAGNOSIS — M1612 Unilateral primary osteoarthritis, left hip: Secondary | ICD-10-CM | POA: Diagnosis not present

## 2015-06-02 DIAGNOSIS — N401 Enlarged prostate with lower urinary tract symptoms: Secondary | ICD-10-CM | POA: Diagnosis not present

## 2015-06-02 DIAGNOSIS — N138 Other obstructive and reflux uropathy: Secondary | ICD-10-CM | POA: Diagnosis not present

## 2015-08-15 DIAGNOSIS — M1712 Unilateral primary osteoarthritis, left knee: Secondary | ICD-10-CM | POA: Diagnosis not present

## 2015-10-13 DIAGNOSIS — L281 Prurigo nodularis: Secondary | ICD-10-CM | POA: Diagnosis not present

## 2015-10-13 DIAGNOSIS — L821 Other seborrheic keratosis: Secondary | ICD-10-CM | POA: Diagnosis not present

## 2015-10-13 DIAGNOSIS — L570 Actinic keratosis: Secondary | ICD-10-CM | POA: Diagnosis not present

## 2015-10-13 DIAGNOSIS — Z85828 Personal history of other malignant neoplasm of skin: Secondary | ICD-10-CM | POA: Diagnosis not present

## 2015-11-03 DIAGNOSIS — Z961 Presence of intraocular lens: Secondary | ICD-10-CM | POA: Diagnosis not present

## 2015-11-03 DIAGNOSIS — H40013 Open angle with borderline findings, low risk, bilateral: Secondary | ICD-10-CM | POA: Diagnosis not present

## 2015-11-17 DIAGNOSIS — Z1211 Encounter for screening for malignant neoplasm of colon: Secondary | ICD-10-CM | POA: Diagnosis not present

## 2015-11-27 DIAGNOSIS — H2640 Unspecified secondary cataract: Secondary | ICD-10-CM | POA: Diagnosis not present

## 2015-11-27 DIAGNOSIS — H26492 Other secondary cataract, left eye: Secondary | ICD-10-CM | POA: Diagnosis not present

## 2015-12-02 DIAGNOSIS — I499 Cardiac arrhythmia, unspecified: Secondary | ICD-10-CM | POA: Diagnosis not present

## 2015-12-02 DIAGNOSIS — I493 Ventricular premature depolarization: Secondary | ICD-10-CM | POA: Diagnosis not present

## 2016-01-26 DIAGNOSIS — H26491 Other secondary cataract, right eye: Secondary | ICD-10-CM | POA: Diagnosis not present

## 2016-02-05 DIAGNOSIS — H26491 Other secondary cataract, right eye: Secondary | ICD-10-CM | POA: Diagnosis not present

## 2016-02-05 DIAGNOSIS — H2640 Unspecified secondary cataract: Secondary | ICD-10-CM | POA: Diagnosis not present

## 2016-02-16 DIAGNOSIS — M25512 Pain in left shoulder: Secondary | ICD-10-CM | POA: Diagnosis not present

## 2016-02-16 DIAGNOSIS — M25511 Pain in right shoulder: Secondary | ICD-10-CM | POA: Diagnosis not present

## 2016-02-16 DIAGNOSIS — R002 Palpitations: Secondary | ICD-10-CM | POA: Diagnosis not present

## 2016-03-03 DIAGNOSIS — R002 Palpitations: Secondary | ICD-10-CM | POA: Diagnosis not present

## 2016-08-09 DIAGNOSIS — M1612 Unilateral primary osteoarthritis, left hip: Secondary | ICD-10-CM | POA: Diagnosis not present

## 2016-08-09 DIAGNOSIS — Z96642 Presence of left artificial hip joint: Secondary | ICD-10-CM | POA: Diagnosis not present

## 2016-10-11 DIAGNOSIS — L570 Actinic keratosis: Secondary | ICD-10-CM | POA: Diagnosis not present

## 2016-10-11 DIAGNOSIS — D1801 Hemangioma of skin and subcutaneous tissue: Secondary | ICD-10-CM | POA: Diagnosis not present

## 2016-10-11 DIAGNOSIS — Z85828 Personal history of other malignant neoplasm of skin: Secondary | ICD-10-CM | POA: Diagnosis not present

## 2016-10-11 DIAGNOSIS — L821 Other seborrheic keratosis: Secondary | ICD-10-CM | POA: Diagnosis not present

## 2016-10-13 DIAGNOSIS — N471 Phimosis: Secondary | ICD-10-CM | POA: Diagnosis not present

## 2016-11-22 DIAGNOSIS — S60512A Abrasion of left hand, initial encounter: Secondary | ICD-10-CM | POA: Diagnosis not present

## 2016-11-22 DIAGNOSIS — W5503XA Scratched by cat, initial encounter: Secondary | ICD-10-CM | POA: Diagnosis not present

## 2016-11-22 DIAGNOSIS — Z23 Encounter for immunization: Secondary | ICD-10-CM | POA: Diagnosis not present

## 2016-12-29 DIAGNOSIS — Z961 Presence of intraocular lens: Secondary | ICD-10-CM | POA: Diagnosis not present

## 2016-12-29 DIAGNOSIS — H40013 Open angle with borderline findings, low risk, bilateral: Secondary | ICD-10-CM | POA: Diagnosis not present

## 2016-12-29 DIAGNOSIS — H532 Diplopia: Secondary | ICD-10-CM | POA: Diagnosis not present

## 2016-12-29 DIAGNOSIS — H5213 Myopia, bilateral: Secondary | ICD-10-CM | POA: Diagnosis not present

## 2017-01-26 DIAGNOSIS — Z1211 Encounter for screening for malignant neoplasm of colon: Secondary | ICD-10-CM | POA: Diagnosis not present

## 2017-09-09 DIAGNOSIS — H01002 Unspecified blepharitis right lower eyelid: Secondary | ICD-10-CM | POA: Diagnosis not present

## 2017-09-09 DIAGNOSIS — H01005 Unspecified blepharitis left lower eyelid: Secondary | ICD-10-CM | POA: Diagnosis not present

## 2017-09-24 DIAGNOSIS — Z23 Encounter for immunization: Secondary | ICD-10-CM | POA: Diagnosis not present

## 2017-10-17 DIAGNOSIS — L821 Other seborrheic keratosis: Secondary | ICD-10-CM | POA: Diagnosis not present

## 2017-10-17 DIAGNOSIS — D171 Benign lipomatous neoplasm of skin and subcutaneous tissue of trunk: Secondary | ICD-10-CM | POA: Diagnosis not present

## 2017-10-17 DIAGNOSIS — Z85828 Personal history of other malignant neoplasm of skin: Secondary | ICD-10-CM | POA: Diagnosis not present

## 2017-12-30 DIAGNOSIS — H5213 Myopia, bilateral: Secondary | ICD-10-CM | POA: Diagnosis not present

## 2017-12-30 DIAGNOSIS — H5000 Unspecified esotropia: Secondary | ICD-10-CM | POA: Diagnosis not present

## 2017-12-30 DIAGNOSIS — Z961 Presence of intraocular lens: Secondary | ICD-10-CM | POA: Diagnosis not present

## 2018-02-03 DIAGNOSIS — R030 Elevated blood-pressure reading, without diagnosis of hypertension: Secondary | ICD-10-CM | POA: Diagnosis not present

## 2018-02-03 DIAGNOSIS — N400 Enlarged prostate without lower urinary tract symptoms: Secondary | ICD-10-CM | POA: Diagnosis not present

## 2018-02-03 DIAGNOSIS — Z0000 Encounter for general adult medical examination without abnormal findings: Secondary | ICD-10-CM | POA: Diagnosis not present

## 2018-02-03 DIAGNOSIS — Z23 Encounter for immunization: Secondary | ICD-10-CM | POA: Diagnosis not present

## 2018-02-03 DIAGNOSIS — M1990 Unspecified osteoarthritis, unspecified site: Secondary | ICD-10-CM | POA: Diagnosis not present

## 2018-05-23 ENCOUNTER — Ambulatory Visit (INDEPENDENT_AMBULATORY_CARE_PROVIDER_SITE_OTHER): Payer: Medicare Other

## 2018-05-23 ENCOUNTER — Encounter (INDEPENDENT_AMBULATORY_CARE_PROVIDER_SITE_OTHER): Payer: Self-pay | Admitting: Orthopaedic Surgery

## 2018-05-23 ENCOUNTER — Ambulatory Visit (INDEPENDENT_AMBULATORY_CARE_PROVIDER_SITE_OTHER): Payer: Medicare Other | Admitting: Orthopaedic Surgery

## 2018-05-23 VITALS — BP 154/99 | HR 127 | Ht 66.0 in | Wt 168.0 lb

## 2018-05-23 DIAGNOSIS — M25561 Pain in right knee: Secondary | ICD-10-CM

## 2018-05-23 DIAGNOSIS — M25511 Pain in right shoulder: Secondary | ICD-10-CM | POA: Diagnosis not present

## 2018-05-23 DIAGNOSIS — G8929 Other chronic pain: Secondary | ICD-10-CM

## 2018-05-23 NOTE — Progress Notes (Signed)
Office Visit Note   Patient: Aaron Alvarez           Date of Birth: 05-11-1925           MRN: [id number] Visit Date: 05/23/2018              Requested by: No referring provider defined for this encounter. PCP: Shirline Frees, MD   Assessment & Plan: Visit Diagnoses:  1. Chronic pain of right knee   2. Chronic right shoulder pain     Plan: Long discussion regarding x-ray findings and treatment options.  Mr. Zeiss with ice or heat.  He may also take over-the-counter medicines.  He stays active and exercises.  I have offered him cortisone injections at some point in the future.  Despite the x-ray findings of arthritis he is actually done very well .We will plan to see him back as needed  Follow-Up Instructions: No follow-ups on file.   Orders:  Orders Placed This Encounter  Procedures  . XR KNEE 3 VIEW RIGHT  . XR Shoulder Right   No orders of the defined types were placed in this encounter.     Procedures: No procedures performed   Clinical Data: No additional findings.   Subjective: Chief Complaint  Patient presents with  . Right Shoulder - Pain    ROTATOR CUFF REPAIR MANY YEARS AGO BY DR Durward Fortes  . Right Knee - Pain  . Follow-up    R KNEE PAIN IN THE EVENING  Mr. Moorman and his wife are moving to [place] to live with his oldest son.  He visits the office today for evaluation of some pain he is having in his right knee and right shoulder.  Mr. Grudzien has had a prior left total hip and a left total knee replacement and doing well from that standpoint.  He walks an hour a day 6 days a week.  He also plays golf times a week.  He is able to perform all of the above activities without too much trouble.  He wants to be sure that there is not a problem now that he is will be moving and having to establish new physicians.  He has some occasional twinges of pain in his right knee with some swelling.  Not had any sensation of his knee giving way.  He has  had some catching in his right shoulder with difficulty on occasion raising his arm fully over his head.  Has not had any numbness or tingling.  No injury or trauma.  He had a rotator cuff tear repair 2004 on the right shoulder  HPI  Review of Systems  Constitutional: Negative for fatigue and fever.  HENT: Negative for ear pain.   Eyes: Negative for pain.  Respiratory: Negative for cough and shortness of breath.   Cardiovascular: Negative for leg swelling.  Gastrointestinal: Positive for constipation. Negative for diarrhea.  Genitourinary: Positive for frequency. Negative for difficulty urinating.  Musculoskeletal: Negative for back pain and neck pain.  Skin: Negative for rash.  Allergic/Immunologic: Negative for food allergies.  Neurological: Positive for weakness. Negative for numbness.  Hematological: Bruises/bleeds easily.  Psychiatric/Behavioral: Positive for sleep disturbance.     Objective: Vital Signs: There were no vitals taken for this visit.  Physical Exam  Constitutional: He is oriented to person, place, and time. He appears well-developed and well-nourished.  HENT:  Mouth/Throat: Oropharynx is clear and moist.  Eyes: Pupils are equal, round, and reactive to light. EOM are normal.  Pulmonary/Chest: Effort normal.  Neurological: He is alert and oriented to person, place, and time.  Skin: Skin is warm and dry.  Psychiatric: He has a normal mood and affect. His behavior is normal.    Ortho Exam awake alert and oriented x3.  Comfortable sitting.  Hard of hearing with bilateral hearing aids.  Minimal effusion right knee with predominantly medial joint pain.  Full extension 120 degrees of flexion.  No instability.  Skin intact.  No pain range of motion right hip. Right shoulder with full overhead flexion.  Small circuitous arc of motion.  Some crepitation with internal/external rotation of the impingement position beneath the anterior subacromial region.  Biceps intact.   Skin intact.  Good grip and good release.  Specialty Comments:  No specialty comments available.  Imaging: No results found.   PMFS History: Patient Active Problem List   Diagnosis Date Noted  . BPH (benign prostatic hypertrophy) 02/12/2015  . Post-traumatic osteoarthritis of left hip 02/06/2015  . Postoperative anemia due to acute blood loss 02/06/2015  . S/P total hip arthroplasty 02/04/2015   Past Medical History:  Diagnosis Date  . Arthritis   . Asthma   . BPH (benign prostatic hyperplasia)   . Cancer ([place])   . DJD (degenerative joint disease)   . GERD (gastroesophageal reflux disease)   . Hearing difficulty of both ears    wears hearing aides  . History of double vision   . Hx of skin cancer, basal cell   . Mild exercise-induced asthma   . Wears partial dentures     History reviewed. No pertinent family history.  Past Surgical History:  Procedure Laterality Date  . CATARACT EXTRACTION W/ INTRAOCULAR LENS  IMPLANT, BILATERAL Bilateral   . COLONOSCOPY W/ POLYPECTOMY  2012   [facility]  . EYE SURGERY    . JOINT REPLACEMENT    . ROTATOR CUFF REPAIR Right 2004  . TONSILLECTOMY    . TOTAL HIP ARTHROPLASTY Left 02/04/2015   Procedure: LEFT TOTAL HIP ARTHROPLASTY;  Surgeon: Garald Balding, MD;  Location: [place];  Service: Orthopedics;  Laterality: Left;  . TOTAL KNEE ARTHROPLASTY Left 2002   Social History   Occupational History  . Not on file  Tobacco Use  . Smoking status: Former Smoker    Packs/day: 3.00    Years: 12.00    Pack years: 36.00    Types: Cigarettes, Pipe    Last attempt to quit: 02/22/1966    Years since quitting: 52.2  . Smokeless tobacco: Never Used  Substance and Sexual Activity  . Alcohol use: Not on file    Comment: red wine daily  . Drug use: No  . Sexual activity: Never

## 2018-07-13 DIAGNOSIS — L821 Other seborrheic keratosis: Secondary | ICD-10-CM | POA: Diagnosis not present

## 2018-07-13 DIAGNOSIS — Z85828 Personal history of other malignant neoplasm of skin: Secondary | ICD-10-CM | POA: Diagnosis not present

## 2018-07-13 DIAGNOSIS — L820 Inflamed seborrheic keratosis: Secondary | ICD-10-CM | POA: Diagnosis not present

## 2018-07-13 DIAGNOSIS — L570 Actinic keratosis: Secondary | ICD-10-CM | POA: Diagnosis not present

## 2018-11-07 DIAGNOSIS — N401 Enlarged prostate with lower urinary tract symptoms: Secondary | ICD-10-CM | POA: Diagnosis not present

## 2018-11-07 DIAGNOSIS — Z77090 Contact with and (suspected) exposure to asbestos: Secondary | ICD-10-CM | POA: Diagnosis not present

## 2018-11-07 DIAGNOSIS — M1712 Unilateral primary osteoarthritis, left knee: Secondary | ICD-10-CM | POA: Diagnosis not present

## 2018-11-07 DIAGNOSIS — R350 Frequency of micturition: Secondary | ICD-10-CM | POA: Diagnosis not present

## 2018-11-07 DIAGNOSIS — Z6828 Body mass index (BMI) 28.0-28.9, adult: Secondary | ICD-10-CM | POA: Diagnosis not present

## 2018-11-07 DIAGNOSIS — B0229 Other postherpetic nervous system involvement: Secondary | ICD-10-CM | POA: Diagnosis not present

## 2018-11-07 DIAGNOSIS — M1612 Unilateral primary osteoarthritis, left hip: Secondary | ICD-10-CM | POA: Diagnosis not present

## 2018-11-13 DIAGNOSIS — M1712 Unilateral primary osteoarthritis, left knee: Secondary | ICD-10-CM | POA: Diagnosis not present

## 2018-11-13 DIAGNOSIS — R350 Frequency of micturition: Secondary | ICD-10-CM | POA: Diagnosis not present

## 2018-11-13 DIAGNOSIS — M1612 Unilateral primary osteoarthritis, left hip: Secondary | ICD-10-CM | POA: Diagnosis not present

## 2018-11-13 DIAGNOSIS — N401 Enlarged prostate with lower urinary tract symptoms: Secondary | ICD-10-CM | POA: Diagnosis not present

## 2018-11-13 LAB — COMPREHENSIVE METABOLIC PANEL
ALT: 13 U/L (ref 0–41)
AST: 18 U/L (ref 0–40)
Albumin/Globulin Ratio: 2.4 mmol/L — ABNORMAL HIGH (ref 1.00–2.00)
Albumin: 4.4 g/dL (ref 3.5–5.2)
Alk Phosphatase: 94 U/L (ref 40–130)
Anion Gap: 8 mmol/L (ref 2–17)
BUN: 14 mg/dL (ref 8–23)
CO2: 30 mmol/L — ABNORMAL HIGH (ref 22–29)
Calcium: 9.7 mg/dL (ref 8.8–10.2)
Chloride: 102 mmol/L (ref 98–107)
Creatinine: 0.6 mg/dL — ABNORMAL LOW (ref 0.7–1.3)
GFR African American: 102 mL/min/{1.73_m2} (ref >=90)
GFR Non-African American: 88 mL/min/{1.73_m2} — ABNORMAL LOW (ref >=90)
Globulin: 2 g/dL (ref 1.9–4.4)
Glucose: 90 mg/dL (ref 70–99)
OSMOLALITY CALCULATED: 279 mOsm/kg (ref 270–287)
Potassium: 4.5 mmol/L (ref 3.5–5.3)
Sodium: 140 mmol/L (ref 135–145)
Total Bilirubin: 0.5 mg/dL (ref 0.00–1.20)
Total Protein: 6.2 g/dL — ABNORMAL LOW (ref 6.4–8.3)

## 2018-11-13 LAB — LIPID PANEL
Chol/HDL Ratio: 2.2 (ref 0.0–4.4)
Cholesterol: 145 mg/dL (ref 100–200)
HDL: 65 mg/dL (ref 55–72)
LDL Cholesterol: 66.8 mg/dL (ref 0.0–100.0)
LDL/HDL Ratio: 1
Triglycerides: 66 mg/dL (ref 0–149)
VLDL: 13.2 mg/dL (ref 5.0–40.0)

## 2018-11-13 LAB — TSH WITH REFLEX TO FT4: TSH: 1.64 mcIU/mL (ref 0.358–3.740)

## 2018-11-14 LAB — CBC
Hematocrit: 38 % (ref 38.0–52.0)
Hemoglobin: 13.1 g/dL (ref 13.0–17.3)
MCH: 29.8 pg (ref 27.0–34.5)
MCHC: 34.3 g/dL (ref 32.0–36.0)
MCV: 86.9 fL (ref 84.0–100.0)
MPV: 9.1 fL (ref 7.2–13.2)
Platelets: 215 10*3/uL (ref 140–440)
RBC: 4.38 x10e6/mcL (ref 4.00–5.60)
RDW: 13 % (ref 11.0–16.0)
WBC: 5.5 10*3/uL (ref 3.8–10.6)

## 2018-12-07 DIAGNOSIS — M1712 Unilateral primary osteoarthritis, left knee: Secondary | ICD-10-CM | POA: Diagnosis not present

## 2018-12-07 DIAGNOSIS — M1612 Unilateral primary osteoarthritis, left hip: Secondary | ICD-10-CM | POA: Diagnosis not present

## 2018-12-07 DIAGNOSIS — R350 Frequency of micturition: Secondary | ICD-10-CM | POA: Diagnosis not present

## 2019-03-08 DIAGNOSIS — M1712 Unilateral primary osteoarthritis, left knee: Secondary | ICD-10-CM | POA: Diagnosis not present

## 2019-03-08 DIAGNOSIS — N401 Enlarged prostate with lower urinary tract symptoms: Secondary | ICD-10-CM | POA: Diagnosis not present

## 2019-03-08 DIAGNOSIS — M1612 Unilateral primary osteoarthritis, left hip: Secondary | ICD-10-CM | POA: Diagnosis not present

## 2019-04-03 DIAGNOSIS — Z96649 Presence of unspecified artificial hip joint: Secondary | ICD-10-CM | POA: Diagnosis not present

## 2019-04-03 DIAGNOSIS — M25562 Pain in left knee: Secondary | ICD-10-CM | POA: Diagnosis not present

## 2019-04-03 DIAGNOSIS — M25552 Pain in left hip: Secondary | ICD-10-CM | POA: Diagnosis not present

## 2019-04-03 DIAGNOSIS — R2681 Unsteadiness on feet: Secondary | ICD-10-CM | POA: Diagnosis not present

## 2019-04-03 DIAGNOSIS — Z96652 Presence of left artificial knee joint: Secondary | ICD-10-CM | POA: Diagnosis not present

## 2019-04-03 DIAGNOSIS — M25572 Pain in left ankle and joints of left foot: Secondary | ICD-10-CM | POA: Diagnosis not present

## 2019-06-20 LAB — COMPREHENSIVE METABOLIC PANEL
ALT: 12 U/L (ref 0–41)
AST: 24 U/L (ref 0–40)
Albumin/Globulin Ratio: 1.8 mmol/L (ref 1.00–2.00)
Albumin: 4.4 g/dL (ref 3.5–5.2)
Alk Phosphatase: 110 U/L (ref 40–130)
Anion Gap: 14 mmol/L (ref 2–17)
BUN: 14 mg/dL (ref 8–23)
CO2: 22 mmol/L (ref 22–29)
Calcium: 9.2 mg/dL (ref 8.8–10.2)
Chloride: 101 mmol/L (ref 98–107)
Creatinine: 0.5 mg/dL — ABNORMAL LOW (ref 0.7–1.3)
GFR African American: 109 mL/min/{1.73_m2} (ref >=90)
GFR Non-African American: 94 mL/min/{1.73_m2} (ref >=90)
Globulin: 2 g/dL (ref 1.9–4.4)
Glucose: 89 mg/dL (ref 70–99)
OSMOLALITY CALCULATED: 274 mOsm/kg (ref 270–287)
Sodium: 137 mmol/L (ref 135–145)
Total Bilirubin: 0.6 mg/dL (ref 0.00–1.20)
Total Protein: 6.8 g/dL (ref 6.4–8.3)

## 2019-06-20 LAB — LIPID PANEL
Chol/HDL Ratio: 2.3 (ref 0.0–4.4)
Cholesterol: 171 mg/dL (ref 100–200)
HDL: 75 mg/dL — ABNORMAL HIGH (ref 55–72)
LDL Cholesterol: 81 mg/dL (ref 0.0–100.0)
LDL/HDL Ratio: 1.1
Triglycerides: 75 mg/dL (ref 0–149)
VLDL: 15 mg/dL (ref 5.0–40.0)

## 2019-06-20 LAB — CBC
Hematocrit: 41.2 % (ref 38.0–52.0)
Hemoglobin: 14.4 g/dL (ref 12.0–17.3)
MCH: 29.9 pg (ref 27.0–34.5)
MCHC: 35 g/dL (ref 32.0–36.0)
MCV: 85.5 fL (ref 84.0–100.0)
MPV: 10.6 fL (ref 7.2–13.2)
NRBC Absolute: 0 10*3/uL
NRBC Automated: 0 %
Platelets: 266 10*3/uL (ref 140–440)
RBC: 4.82 x10e6/mcL (ref 4.00–5.20)
RDW: 12.2 % (ref 11.0–16.0)
WBC: 7.4 10*3/uL (ref 3.8–10.6)

## 2019-06-20 LAB — TSH WITH REFLEX TO FT4: TSH: 1.62 mcIU/mL (ref 0.358–3.740)

## 2020-12-03 NOTE — ED Notes (Signed)
 ED Patient Education Note     Patient Education Materials Follows:  Musculoskeletal     Muscle Strain    A muscle strain is an injury that occurs when a muscle is stretched beyond its normal length. Usually a small number of muscle fibers are torn when this happens. Muscle strain is rated in degrees. First-degree strains have the least amount of muscle fiber tearing and pain. Second-degree and third-degree strains have increasingly more tearing and pain.     Usually, recovery from muscle strain takes 1?2 weeks. Complete healing takes 5?6 weeks.       CAUSES    Muscle strain happens when a sudden, violent force placed on a muscle stretches it too far. This may occur with lifting, sports, or a fall.     RISK FACTORS    Muscle strain is especially common in athletes.     SIGNS AND SYMPTOMS    At the site of the muscle strain, there may be:     Pain.     Bruising.     Swelling.      Difficulty using the muscle due to pain or lack of normal function.     DIAGNOSIS    Your health care provider will perform a physical exam and ask about your medical history.    TREATMENT    Often, the best treatment for a muscle strain is resting, icing, and applying cold compresses to the injured area. ?    HOME CARE INSTRUCTIONS     Use the PRICE method of treatment to promote muscle healing during the first 2?3 days after your injury. The PRICE method involves:    ? Protecting the muscle from being injured again.    ? Restricting your activity and resting the injured body part.     ? Icing your injury. To do this, put ice in a plastic bag. Place a towel between your skin and the bag. Then, apply the ice and leave it on from 15?20 minutes each hour. After the third day, switch to moist heat packs.     ? Apply compression to the injured area with a splint or elastic bandage. Be careful not to wrap it too tightly. This may interfere with blood circulation or increase swelling.     ? Elevate the injured body part above the level of your  heart as often as you can.     Only take over-the-counter or prescription medicines for pain, discomfort, or fever as directed by your health care provider.      Warming up prior to exercise helps to prevent future muscle strains.     SEEK MEDICAL CARE IF:     You have increasing pain or swelling in the injured area.     You have numbness, tingling, or a significant loss of strength in the injured area.     MAKE SURE YOU:     Understand these instructions.      Will watch your condition.     Will get help right away if you are not doing well or get worse.    This information is not intended to replace advice given to you by your health care provider. Make sure you discuss any questions you have with your health care provider.    Document Released: 12/13/2005 Document Revised: 10/03/2013 Document Reviewed: 07/12/2013  Elsevier Interactive Patient Education ?2016 [employer]
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
## 2020-12-03 NOTE — ED Notes (Signed)
ED Pre-Arrival Note        Pre-Arrival Summary    Name:  [id number],    Current Date:  12/03/2020 11:07:14 EST  Gender:  Male  Date of Birth:    Age:  86  Pre-Arrival Type:  EMS  ETA:  12/03/2020 11:25:00 EST  Primary Care Physician:    Presenting Problem:  fall from [facility]  Pre-Arrival User:  Earl Lites, RN, Vista Lawman  Referring Source:    Location:  [place]  BP:  139/62  HR:  70  O2:  97            PreArrival Communication Form  Emergency Department        Additional Patient Information:        Orders:  [    ] CBC                                            [     ] CT Head no contrast  [    ] BMP                                           [     ] CT Abdomen/Pelvis no contrast  [    ] PT/INR                                       [     ] CT Abdomen/Pelvis IV contrast, w/ oral contrast  [    ] Troponin                                   [     ] CT Abdomen/Pelvis IV contrast, no oral contrast  [    ] BNP                                            [     ] See ordersheet  [    ] CXR                                             [     ] Other:__________________________  [    ] EKG

## 2020-12-03 NOTE — ED Provider Notes (Signed)
Arm pain-swelling        Patient:   Darren Anderson, Darren Anderson             MRN: [id number]            FIN: [id number]               Age:   86 years     Sex:  Male     DOB:  04-Dec-1925   Associated Diagnoses:   Biceps muscle tear   Author:   Governor Rooks B-MD      Basic Information   Time seen: Provider Seen (ST)   ED Provider/Time:    Governor Rooks B-MD / 12/03/2020 11:09  .   Additional information: Chief Complaint from Nursing Triage Note   Chief Complaint  Chief Complaint: fall at [facility] catching another pt that was falling right arm pain, A/O x4 (12/03/20 11:07:00).      History of Present Illness   The patient presents with right, arm injury, arm pain, 86 year old male was walking with friend at his assisted living who tripped and he caught her and then ease her to the ground and had resultant right arm discomfort.  Pain is minimal.  He has no headache or neck pain and no other extremity injuries.  Pain is worse when he flexes or extends at the elbow.  Discomfort is mid humerus.  Pain is mild states is a 3 at worst.        Review of Systems             Additional review of systems information: All other systems reviewed and otherwise negative.      Health Status   Allergies:    Allergic Reactions (Selected)  No Known Medication Allergies.   Medications:  (Selected)   Documented Medications  Documented  BACID CAPSULES: 0 Refill(s)  DILTIAZEM 24H ER(CD) 180 MG CP: 0 Refill(s)  DILTIAZEM CD 180MG  CAPSULES (24 HR): 0 Refill(s)  GenTeal Tears Moderate ophthalmic solution: 0 Refill(s)  Pacerone 200 mg oral tablet: 200 mg, 1 tabs, Oral, Daily, 30 tabs, 0 Refill(s)  alfuzosin 10 mg oral tablet, extended release: 10 mg, 1 tabs, Oral, Daily, 30 tabs, 0 Refill(s)  sertraline 25 mg oral tablet: 25 mg, 1 tabs, Oral, Daily, 30 tabs, 0 Refill(s).      Past Medical/ Family/ Social History   Medical history: Reviewed as documented in chart.   Surgical history: Reviewed as documented in chart.   Family history: Not significant.    Social history: Reviewed as documented in chart.   Problem list:    No qualifying data available  .      Physical Examination               Vital Signs   Vital Signs   12/03/2020 11:07 EST Systolic Blood Pressure 137 mmHg    Diastolic Blood Pressure 64 mmHg    Temperature Oral 36.8 degC    Heart Rate Monitored 70 bpm    Respiratory Rate 18 br/min    SpO2 97 %   .   Measurements   12/03/2020 11:09 EST Body Mass Index est meas 30.47 kg/m2    Body Mass Index Measured 30.47 kg/m2   12/03/2020 11:07 EST Height/Length Measured 168 cm    Weight Dosing 86 kg   .   Basic Oxygen Information   12/03/2020 11:07 EST Oxygen Therapy Room air    SpO2 97 %   .   General:  Alert, no  acute distress.    Skin:  Warm, dry.    Head:  Normocephalic.   Respiratory:  Respirations are non-labored.   Chest wall:  No tenderness.   Musculoskeletal:  Right mid humeral area there is evidence of a biceps tear with some bunching in the mid humeral region anteriorly pain with supination against resistance minimal.  He still is able to flex and extend fully.  He has no wrist or elbow pain no bony tenderness.  Shoulders nontender no range of motion pain.  Is cervical thoracic and lumbar spines are nontender he sits up without discomfort.   Gastrointestinal:  Soft, Nontender.    Neurological:  Alert and oriented to person, place, time, and situation, No focal neurological deficit observed.    Lymphatics:  No lymphadenopathy.   Psychiatric:  Cooperative, appropriate mood & affect.       Medical Decision Making   Rationale:  Past medical and surgical history reviewed by me and also meds and allergies.   Documents reviewed:  Emergency department nurses' notes.   Notes:  Patient appears to have a partial tear of bicep muscle.  Patient will need to follow-up with Ortho she is for recheck in rehabilitation discussions because of age I will keep him out of a sling as this is likely to increase his risk for fall which could be far worse than his current injury.   Has good range of motion and use of the arm as it is.      Reexamination/ Reevaluation   Vital signs   Basic Oxygen Information   12/03/2020 11:07 EST Oxygen Therapy Room air    SpO2 97 %         Impression and Plan   Diagnosis   Biceps muscle tear (ICD10-CM S46.219A, Discharge, Medical)   Plan   Condition: Stable.    Patient was given the following educational materials: Muscle Strain.    Follow up with: ROBERT SULLIVAN Within 2 to 4 days, ROBERT SULLIVAN Within 2 to 4 days.    Counseled: Patient, Regarding diagnosis, Regarding diagnostic results.    Signature Line     Electronically Signed on 12/03/2020 11:34 AM EST   ________________________________________________   Governor Rooks B-MD      Electronically Signed on 12/03/2020 11:39 AM EST   ________________________________________________   Governor Rooks B-MD            Modified by: Governor Rooks B-MD on 12/03/2020 11:39 AM EST

## 2020-12-03 NOTE — Discharge Summary (Signed)
ED Clinical Summary                         [facility]  [address]  [place], [place], [address]  [phone]           PERSON INFORMATION  Name: RIDDIK, THIERRY Age:  86 Years DOB: August 07, 1925   Sex: Male Language: English PCP: PCP,  NONE   Marital Status:  Unknown Phone: [phone] Med Service: MED-Medicine   MRN:  [id number] Acct# [id number] Arrival: 12/03/2020 11:06:00   Visit Reason: Arm pain-swelling; EMS, [facility] Acuity: 4 LOS: 000 01:34   Address:      [address] [place] [address]  Diagnosis:      Biceps muscle tear  Printed Prescriptions:            Allergies      No Known Medication Allergies      Medications Administered During Visit:        Patient Medication List:              alfuzosin (alfuzosin 10 mg oral tablet, extended release) 1 Tabs Oral (given by mouth) every day.  amiodarone (Pacerone 200 mg oral tablet) 1 Tabs Oral (given by mouth) every day.  Misc Medication (BACID CAPSULES)  Misc Medication (DILTIAZEM 24H ER(CD) 180 MG CP)  Misc Medication (DILTIAZEM CD 180MG  CAPSULES (24 HR))  ocular lubricant (GenTeal Tears Moderate ophthalmic solution)  sertraline (sertraline 25 mg oral tablet) 1 Tabs Oral (given by mouth) every day., SOUND ALIKE / LOOK ALIKE - VERIFY DRUG         Major Tests and Procedures:  The following procedures and tests were performed during your ED visit.  COMMONPROCEDURES%>  COMMON PROCEDURESCOMMENTS%>          Laboratory Orders  No laboratory orders were placed.              Radiology Orders  No radiology orders were placed.              Patient Care Orders  Name Status Details   Discharge Patient Ordered 12/03/20 11:34:00 EST   ED Assessment Adult Completed 12/03/20 11:09:26 EST, 12/03/20 11:09:26 EST   ED Secondary Triage Completed 12/03/20 11:09:26 EST, 12/03/20 11:09:26 EST   ED Triage Adult Completed 12/03/20 11:06:33 EST, 12/03/20 11:06:33 EST             PROVIDER INFORMATION               Provider Role Assigned  Darcus Pester B-MD ED Provider 12/03/2020 11:09:17    Johnnye Sima ED Nurse 12/03/2020 11:12:29        Attending Physician:  Governor Rooks B-MD     Admit Doc  MOE,  CHRISTOPHER B-MD     Consulting Doc       VITALS INFORMATION  Vital Sign Triage Latest   Temp Oral ORAL_1%>36.8 degC ORAL%>36.8 degC   Temp Temporal TEMPORAL_1%> TEMPORAL%>   Temp Intravascular INTRAVASCULAR_1%> INTRAVASCULAR%>   Temp Axillary AXILLARY_1%> AXILLARY%>   Temp Rectal RECTAL_1%> RECTAL%>   02 Sat 97 % 97 %   Respiratory Rate RATE_1%>18 br/min RATE%>18 br/min   Peripheral Pulse Rate PULSE RATE_1%> PULSE RATE%>   Apical Heart Rate HEART RATE_1%> HEART RATE%>   Blood Pressure BLOOD PRESSURE_1%>/ BLOOD PRESSURE_1%>64 mmHg BLOOD PRESSURE%>137 mmHg / BLOOD PRESSURE%>64 mmHg  Immunizations      No Immunizations Documented This Visit          DISCHARGE INFORMATION   Discharge Disposition: H Outpt-Sent Home   Discharge Location:    Home   Discharge Date and Time:    12/03/2020 12:40:25   ED Checkout Date and Time:    12/03/2020 12:40:25     DEPART REASON INCOMPLETE INFORMATION               Depart Action Incomplete Reason   Interactive View/I&O Recently assessed               Problems      No Problems Documented              Smoking Status      No Smoking Status Documented         PATIENT EDUCATION INFORMATION  Instructions:       Muscle Strain     Follow up:                    With: Address: When:   ROBERT SULLIVAN [address] [place] [address]  [phone] Business (1) Within 2 to 4 days           ED PROVIDER DOCUMENTATION     Patient:   KEKOA, DISBROW             MRN: [id number]            FIN: [id number]               Age:   37 years     Sex:  Male     DOB:  09-Oct-1925   Associated Diagnoses:   Biceps muscle tear   Author:   Governor Rooks B-MD      Basic Information   Time seen: Provider Seen (ST)   ED Provider/Time:    Governor Rooks B-MD / 12/03/2020 11:09  .   Additional information: Chief  Complaint from Nursing Triage Note   Chief Complaint  Chief Complaint: fall at [facility] catching another pt that was falling right arm pain, A/O x4 (12/03/20 11:07:00).      History of Present Illness   The patient presents with right, arm injury, arm pain, 86 year old male was walking with friend at his assisted living who tripped and he caught her and then ease her to the ground and had resultant right arm discomfort.  Pain is minimal.  He has no headache or neck pain and no other extremity injuries.  Pain is worse when he flexes or extends at the elbow.  Discomfort is mid humerus.  Pain is mild states is a 3 at worst.        Review of Systems             Additional review of systems information: All other systems reviewed and otherwise negative.      Health Status   Allergies:    Allergic Reactions (Selected)  No Known Medication Allergies.   Medications:  (Selected)   Documented Medications  Documented  BACID CAPSULES: 0 Refill(s)  DILTIAZEM 24H ER(CD) 180 MG CP: 0 Refill(s)  DILTIAZEM CD 180MG  CAPSULES (24 HR): 0 Refill(s)  GenTeal Tears Moderate ophthalmic solution: 0 Refill(s)  Pacerone 200 mg oral tablet: 200 mg, 1 tabs, Oral, Daily, 30 tabs, 0 Refill(s)  alfuzosin 10 mg oral tablet, extended release: 10 mg, 1 tabs, Oral, Daily, 30 tabs, 0 Refill(s)  sertraline 25 mg  oral tablet: 25 mg, 1 tabs, Oral, Daily, 30 tabs, 0 Refill(s).      Past Medical/ Family/ Social History   Medical history: Reviewed as documented in chart.   Surgical history: Reviewed as documented in chart.   Family history: Not significant.   Social history: Reviewed as documented in chart.   Problem list:    No qualifying data available  .      Physical Examination               Vital Signs   Vital Signs   12/03/2020 11:07 EST Systolic Blood Pressure 137 mmHg    Diastolic Blood Pressure 64 mmHg    Temperature Oral 36.8 degC    Heart Rate Monitored 70 bpm    Respiratory Rate 18 br/min    SpO2 97 %   .   Measurements   12/03/2020 11:09 EST Body  Mass Index est meas 30.47 kg/m2    Body Mass Index Measured 30.47 kg/m2   12/03/2020 11:07 EST Height/Length Measured 168 cm    Weight Dosing 86 kg   .   Basic Oxygen Information   12/03/2020 11:07 EST Oxygen Therapy Room air    SpO2 97 %   .   General:  Alert, no acute distress.    Skin:  Warm, dry.    Head:  Normocephalic.   Respiratory:  Respirations are non-labored.   Chest wall:  No tenderness.   Musculoskeletal:  Right mid humeral area there is evidence of a biceps tear with some bunching in the mid humeral region anteriorly pain with supination against resistance minimal.  He still is able to flex and extend fully.  He has no wrist or elbow pain no bony tenderness.  Shoulders nontender no range of motion pain.  Is cervical thoracic and lumbar spines are nontender he sits up without discomfort.   Gastrointestinal:  Soft, Nontender.    Neurological:  Alert and oriented to person, place, time, and situation, No focal neurological deficit observed.    Lymphatics:  No lymphadenopathy.   Psychiatric:  Cooperative, appropriate mood & affect.       Medical Decision Making   Rationale:  Past medical and surgical history reviewed by me and also meds and allergies.   Documents reviewed:  Emergency department nurses' notes.   Notes:  Patient appears to have a partial tear of bicep muscle.  Patient will need to follow-up with Ortho she is for recheck in rehabilitation discussions because of age I will keep him out of a sling as this is likely to increase his risk for fall which could be far worse than his current injury.  Has good range of motion and use of the arm as it is.      Reexamination/ Reevaluation   Vital signs   Basic Oxygen Information   12/03/2020 11:07 EST Oxygen Therapy Room air    SpO2 97 %         Impression and Plan   Diagnosis   Biceps muscle tear (ICD10-CM S46.219A, Discharge, Medical)   Plan   Condition: Stable.    Patient was given the following educational materials: Muscle Strain.    Follow up with:  ROBERT SULLIVAN Within 2 to 4 days, ROBERT SULLIVAN Within 2 to 4 days.    Counseled: Patient, Regarding diagnosis, Regarding diagnostic results.

## 2020-12-03 NOTE — ED Notes (Signed)
 ED Patient Summary       ;          [facility]  [address] [place], [place] [address]  [phone]  Discharge Instructions (Patient)  Darren Anderson, Darren Anderson  DOB:  Feb 11, 1925                   MRN: [id number]                   FIN: [phone]  Reason For Visit: Arm pain-swelling; EMS, [facility]  Final Diagnosis: Biceps muscle tear     Visit Date: 12/03/2020 11:06:00  Address: [address] [place] [address]  Phone: [phone]     Emergency Department Providers:         Primary Physician:      ABRAM LONNI NOVAK      [facility] would like to thank you for allowing us  to assist you with your healthcare needs. The following includes patient education materials and information regarding your injury/illness.     Follow-up Instructions:  You were seen today on an emergency basis. Please contact your primary care doctor for a follow up appointment. If you received a referral to a specialist doctor, it is important you follow-up as instructed.    It is important that you call your follow-up doctor to schedule and confirm the location of your next appointment. Your doctor may practice at multiple locations. The office location of your follow-up appointment may be different to the one written on your discharge instructions.    If you do not have a primary care doctor, please call [phone] for help in finding a [facility] Provider. For help in finding a specialist doctor, please call [phone].    If your condition gets worse before your follow-up with your primary care doctor or specialist, please return to the Emergency Department.      Coronavirus 2019 (COVID-19) Reminders:     Patients age 56 - 101, with parental consent, and over age 76 can make an appointment for a COVID-19 vaccine. Patients can contact their [facility] doctors' offices to schedule an appointment to receive the COVID-19 vaccine. Patients who  do not have a [facility] physician can call [phone] to schedule vaccination appointments.      Follow Up Appointments:  Primary Care Provider:     Name: PCP,  NONE     Phone:                  With: Address: When:   ROBERT SULLIVAN [address] [place] [address]  [phone] Business (1) Within 2 to 4 days                   Medications that have not changed  Other Medications  alfuzosin (alfuzosin 10 mg oral tablet, extended release) 1 Tabs Oral (given by mouth) every day.  Last Dose:____________________  amiodarone (Pacerone 200 mg oral tablet) 1 Tabs Oral (given by mouth) every day.  Last Dose:____________________  Misc Medication (BACID CAPSULES)   Last Dose:____________________  Misc Medication (DILTIAZEM 24H ER(CD) 180 MG CP)   Last Dose:____________________  Misc Medication (DILTIAZEM CD 180MG  CAPSULES (24 HR))   Last Dose:____________________  ocular lubricant (GenTeal Tears Moderate ophthalmic solution)   Last Dose:____________________  sertraline (sertraline 25 mg oral tablet) 1 Tabs Oral (given by mouth) every day., SOUND ALIKE / LOOK ALIKE - VERIFY  DRUG  Last Dose:____________________      Allergy Info: No Known Medication Allergies     Discharge Additional Information          Discharge Patient 12/03/20 11:34:00 EST      Patient Education Materials:        Muscle Strain    A muscle strain is an injury that occurs when a muscle is stretched beyond its normal length. Usually a small number of muscle fibers are torn when this happens. Muscle strain is rated in degrees. First-degree strains have the least amount of muscle fiber tearing and pain. Second-degree and third-degree strains have increasingly more tearing and pain.     Usually, recovery from muscle strain takes 1?2 weeks. Complete healing takes 5?6 weeks.       CAUSES    Muscle strain happens when a sudden, violent force placed on a muscle stretches it too far. This may occur with lifting, sports, or a fall.     RISK  FACTORS    Muscle strain is especially common in athletes.     SIGNS AND SYMPTOMS    At the site of the muscle strain, there may be:     Pain.     Bruising.     Swelling.      Difficulty using the muscle due to pain or lack of normal function.     DIAGNOSIS    Your health care provider will perform a physical exam and ask about your medical history.    TREATMENT    Often, the best treatment for a muscle strain is resting, icing, and applying cold compresses to the injured area. ?    HOME CARE INSTRUCTIONS     Use the PRICE method of treatment to promote muscle healing during the first 2?3 days after your injury. The PRICE method involves:    ? Protecting the muscle from being injured again.    ? Restricting your activity and resting the injured body part.     ? Icing your injury. To do this, put ice in a plastic bag. Place a towel between your skin and the bag. Then, apply the ice and leave it on from 15?20 minutes each hour. After the third day, switch to moist heat packs.     ? Apply compression to the injured area with a splint or elastic bandage. Be careful not to wrap it too tightly. This may interfere with blood circulation or increase swelling.     ? Elevate the injured body part above the level of your heart as often as you can.     Only take over-the-counter or prescription medicines for pain, discomfort, or fever as directed by your health care provider.      Warming up prior to exercise helps to prevent future muscle strains.     SEEK MEDICAL CARE IF:     You have increasing pain or swelling in the injured area.     You have numbness, tingling, or a significant loss of strength in the injured area.     MAKE SURE YOU:     Understand these instructions.      Will watch your condition.     Will get help right away if you are not doing well or get worse.    This information is not intended to replace advice given to you by your health care provider. Make sure you discuss any questions you have with your  health care provider.    Document Released:  12/13/2005 Document Revised: 10/03/2013 Document Reviewed: 07/12/2013  Elsevier Interactive Patient Education ?2016 [employer]      ---------------------------------------------------------------------------------------------------------------------  [facility]) encourages you to self-enroll in the [facility] Patient Portal.  [facility] Patient Portal will allow you to manage your personal health information securely from your own electronic device now and in the future.  To begin your Patient Portal enrollment process, please visit [email or web address] Click on "Sign up now" under [facility].  If you find that you need additional assistance on the [facility] Patient Portal or need a copy of your medical records, please call the [facility] Medical Records Office at [phone].  Comment:

## 2020-12-03 NOTE — ED Notes (Signed)
ED Triage Note       ED Secondary Triage Entered On:  12/03/2020 11:15 EST    Performed On:  12/03/2020 11:12 EST by Marcelle Smiling, RN, Darrick Huntsman Information   Barriers to Learning :   None evident   Languages :   English   Pt. Currently Receiving Radiation :   No   ED Home Meds Section :   Document assessment   [facility] ED Fall Risk Section :   Document assessment   ED Advance Directives Section :   Document assessment   ED Palliative Screen :   Document assessment   Johnnye Sima - 12/03/2020 11:12 EST   (As Of: 12/03/2020 11:15:52 EST)   Diagnoses(Active)    Arm pain-swelling  Date:   12/03/2020 ; Diagnosis Type:   Reason For Visit ; Confirmation:   Complaint of ; Clinical Dx:   Arm pain-swelling ; Classification:   Medical ; Clinical Service:   Emergency medicine ; Code:   PNED ; Probability:   0 ; Diagnosis Code:   [id number]             -    Procedure History   (As Of: 12/03/2020 11:15:52 EST)     [facility] Fall Risk Assessment Tool   Hx of falling last 3 months ED Fall :   No   Patient confused or disoriented ED Fall :   No   Patient intoxicated or sedated ED Fall :   No   Patient impaired gait ED Fall :   No   Use a mobility assistance device ED Fall :   No   Patient altered elimination ED Fall :   No   [facility] ED Fall Score :   0    Johnnye Sima - 12/03/2020 11:12 EST   ED Advance Directive   Advance Directive :   No   Marcelle Smiling RNRayna Sexton - 12/03/2020 11:12 EST   Palliative Care   Does the Patient have a Life Limiting Illness :   None of the above   [place], [place] - 12/03/2020 11:12 EST   Med Hx   Medication List   (As Of: 12/03/2020 11:15:52 EST)   Home Meds    Misc Medication  :   Misc Medication ; Status:   Documented ; Ordered As Mnemonic:   BACID CAPSULES ; Simple Display Line:   0 Refill(s) ; Catalog Code:   Misc Medication ; Order Dt/Tm:   12/03/2020 11:15:29 EST          Misc Medication  :   Misc Medication ; Status:   Documented ; Ordered As Mnemonic:   DILTIAZEM CD  180MG  CAPSULES (24 HR) ; Simple Display Line:   0 Refill(s) ; Catalog Code:   Misc Medication ; Order Dt/Tm:   12/03/2020 11:15:29 EST          ocular lubricant  :   ocular lubricant ; Status:   Documented ; Ordered As Mnemonic:   GenTeal Tears Moderate ophthalmic solution ; Simple Display Line:   0 Refill(s) ; Catalog Code:   ocular lubricant ; Order Dt/Tm:   12/03/2020 11:15:29 EST          amiodarone  :   amiodarone ; Status:   Documented ; Ordered As Mnemonic:   Pacerone 200 mg oral tablet ; Simple Display Line:   200 mg, 1 tabs, Oral, Daily, 30 tabs,  0 Refill(s) ; Catalog Code:   amiodarone ; Order Dt/Tm:   12/03/2020 11:15:29 EST          Misc Medication  :   Misc Medication ; Status:   Documented ; Ordered As Mnemonic:   DILTIAZEM 24H ER(CD) 180 MG CP ; Simple Display Line:   0 Refill(s) ; Catalog Code:   Misc Medication ; Order Dt/Tm:   12/03/2020 11:15:29 EST          sertraline  :   sertraline ; Status:   Documented ; Ordered As Mnemonic:   sertraline 25 mg oral tablet ; Simple Display Line:   25 mg, 1 tabs, Oral, Daily, 30 tabs, 0 Refill(s) ; Catalog Code:   sertraline ; Order Dt/Tm:   12/03/2020 11:15:29 EST ; Comment:   SOUND ALIKE / LOOK ALIKE - VERIFY DRUG          alfuzosin  :   alfuzosin ; Status:   Documented ; Ordered As Mnemonic:   alfuzosin 10 mg oral tablet, extended release ; Simple Display Line:   10 mg, 1 tabs, Oral, Daily, 30 tabs, 0 Refill(s) ; Catalog Code:   alfuzosin ; Order Dt/Tm:   12/03/2020 11:15:29 EST

## 2021-01-08 LAB — CBC WITH AUTO DIFFERENTIAL
Absolute Baso #: 0 10*3/uL (ref 0.0–0.2)
Absolute Eos #: 0.1 10*3/uL (ref 0.0–0.5)
Absolute Lymph #: 0.8 10*3/uL — ABNORMAL LOW (ref 1.0–3.2)
Absolute Mono #: 0.4 10*3/uL (ref 0.3–1.0)
Basophils %: 0.5 % (ref 0.0–2.0)
Eosinophils %: 1.1 % (ref 0.0–7.0)
Hematocrit: 37.3 % — ABNORMAL LOW (ref 38.0–52.0)
Hemoglobin: 12.3 g/dL — ABNORMAL LOW (ref 13.0–17.3)
Immature Grans (Abs): 0.03 10*3/uL (ref 0.00–0.06)
Immature Granulocytes: 0.5 % (ref 0.1–0.6)
Lymphocytes: 15 % (ref 15.0–45.0)
MCH: 29 pg (ref 27.0–34.5)
MCHC: 33 g/dL (ref 32.0–36.0)
MCV: 88 fL (ref 84.0–100.0)
MPV: 11.3 fL (ref 7.2–13.2)
Monocytes: 6.6 % (ref 4.0–12.0)
NRBC Absolute: 0 10*3/uL (ref 0.000–0.012)
NRBC Automated: 0 % (ref 0.0–0.2)
Neutrophils %: 76.3 % — ABNORMAL HIGH (ref 42.0–74.0)
Neutrophils Absolute: 4.3 10*3/uL (ref 1.6–7.3)
Platelets: 199 10*3/uL (ref 140–440)
RBC: 4.24 x10e6/mcL (ref 4.00–5.60)
RDW: 13.6 % (ref 11.0–16.0)
WBC: 5.6 10*3/uL (ref 3.8–10.6)

## 2021-01-08 LAB — BASIC METABOLIC PANEL
Anion Gap: 10 mmol/L (ref 2–17)
BUN: 17 mg/dL (ref 8–23)
CO2: 27 mmol/L (ref 22–29)
Calcium: 9.3 mg/dL (ref 8.8–10.2)
Chloride: 103 mmol/L (ref 98–107)
Creatinine: 1 mg/dL (ref 0.7–1.3)
GFR African American: 74 mL/min/{1.73_m2} — ABNORMAL LOW (ref >=90)
GFR Non-African American: 64 mL/min/{1.73_m2} — ABNORMAL LOW (ref >=90)
Glucose: 90 mg/dL (ref 70–99)
OSMOLALITY CALCULATED: 281 mOsm/kg (ref 270–287)
Potassium: 4.8 mmol/L (ref 3.5–5.3)
Sodium: 140 mmol/L (ref 135–145)

## 2021-01-08 LAB — TSH: TSH, 3RD GENERATION: 1.68 mcIU/mL (ref 0.358–3.740)

## 2021-01-08 LAB — HEPATIC FUNCTION PANEL
ALT: 12 U/L (ref 0–41)
AST: 17 U/L (ref 0–40)
Albumin: 3.9 g/dL (ref 3.5–5.2)
Alk Phosphatase: 117 U/L (ref 40–130)
Bilirubin, Direct: <0.2 mg/dL (ref 0.00–0.30)
Total Bilirubin: 0.4 mg/dL (ref 0.00–1.20)
Total Protein: 5.9 g/dL — ABNORMAL LOW (ref 6.4–8.3)

## 2021-01-08 LAB — T4, FREE: T4 Free: 1.53 ng/dL (ref 0.82–1.70)

## 2021-01-17 LAB — CBC WITH AUTO DIFFERENTIAL
Absolute Baso #: 0 10*3/uL (ref 0.0–0.2)
Absolute Eos #: 0 10*3/uL (ref 0.0–0.5)
Absolute Lymph #: 1 10*3/uL (ref 1.0–3.2)
Absolute Mono #: 0.3 10*3/uL (ref 0.3–1.0)
Basophils %: 0.2 % (ref 0.0–2.0)
Eosinophils %: 0.4 % (ref 0.0–7.0)
Hematocrit: 38.5 % (ref 38.0–52.0)
Hemoglobin: 12.6 g/dL — ABNORMAL LOW (ref 13.0–17.3)
Immature Grans (Abs): 0.01 10*3/uL (ref 0.00–0.06)
Immature Granulocytes: 0.2 % (ref 0.1–0.6)
Lymphocytes: 20.9 % (ref 15.0–45.0)
MCH: 29.8 pg (ref 27.0–34.5)
MCHC: 32.7 g/dL (ref 32.0–36.0)
MCV: 91 fL (ref 84.0–100.0)
MPV: 10.8 fL (ref 7.2–13.2)
Monocytes: 5.1 % (ref 4.0–12.0)
Neutrophils %: 73.2 % (ref 42.0–74.0)
Neutrophils Absolute: 3.6 10*3/uL (ref 1.6–7.3)
Platelets: 184 10*3/uL (ref 140–440)
RBC: 4.23 x10e6/mcL (ref 4.00–5.60)
RDW: 12.9 % (ref 11.0–16.0)
WBC: 4.9 10*3/uL (ref 3.8–10.6)

## 2021-01-17 LAB — COVID-19: SARS-CoV-2: DETECTED — AB

## 2021-01-17 LAB — COMPREHENSIVE METABOLIC PANEL
ALT: 8 U/L (ref 0–41)
AST: 17 U/L (ref 0–40)
Albumin/Globulin Ratio: 1 mmol/L (ref 1.00–2.70)
Albumin: 3.6 g/dL (ref 3.5–5.2)
Alk Phosphatase: 125 U/L (ref 40–130)
Anion Gap: 10 mmol/L (ref 2–17)
BUN: 20 mg/dL (ref 8–23)
CO2: 25 mmol/L (ref 22–29)
Calcium: 9.2 mg/dL (ref 8.8–10.2)
Chloride: 100 mmol/L (ref 98–107)
Creatinine: 0.9 mg/dL (ref 0.7–1.3)
GFR African American: 84 mL/min/{1.73_m2} — ABNORMAL LOW (ref >=90)
GFR Non-African American: 73 mL/min/{1.73_m2} — ABNORMAL LOW (ref >=90)
Globulin: 3 g/dL (ref 1.9–4.4)
Glucose: 113 mg/dL — ABNORMAL HIGH (ref 70–99)
OSMOLALITY CALCULATED: 273 mOsm/kg (ref 270–287)
Potassium: 4.3 mmol/L (ref 3.5–5.3)
Sodium: 135 mmol/L (ref 135–145)
Total Bilirubin: 0.28 mg/dL (ref 0.00–1.20)
Total Protein: 6.4 g/dL (ref 6.4–8.3)

## 2021-01-17 LAB — TROPONIN T: Troponin T: <0.01 ng/mL (ref 0.000–0.010)

## 2021-01-17 LAB — D-DIMER, QUANTITATIVE: D-Dimer, Quant: 0.6 mcg/mL FEU — ABNORMAL HIGH (ref 0.19–0.51)

## 2021-01-17 LAB — LACTIC ACID: Lactic Acid: 0.5 mmol/L (ref 0.5–2.0)

## 2021-01-17 LAB — LIPASE: Lipase: 10 U/L — ABNORMAL LOW (ref 13–60)

## 2021-01-17 NOTE — ED Provider Notes (Signed)
Chest Pain *ED        Patient:   Darren Anderson, Darren Anderson             MRN: [id number]            FIN: [id number]               Age:   87 years     Sex:  Male     DOB:  16-May-1925   Associated Diagnoses:   [id number]; Chest pain in adult; Pacemaker; Atypical chest pain   Author:   Darlina Rumpf D-MD      Basic Information   Time seen: Provider Seen (ST)   ED Provider/Time:    Darlina Rumpf D-MD / 01/17/2021 16:53  .   Additional information: Chief Complaint from Nursing Triage Note   Chief Complaint  Chief Complaint: pt arrived via EMS for L side chest "awareness" since 0200 today. denies SHOB, nausea, or diaphoresis. pt from [facility] living [facility] (01/17/21 16:53:00).   87 year old presents evaluation.  He is complaining of left-sided anterior chest pain.  Symptoms have been going on all day he says.  He denies any radiation of the pain to his back or extremities.  Denies any diaphoresis.  He has a paced rhythm on his monitor, he does see a cardiologist but he cannot remember who.  He is not altered.  Denies any dizziness or vertigo or vision changes.  He has no abdominal pain nausea vomiting or diarrhea.  No rashes.  No fever or productive cough.      History of Present Illness   The patient presents with chest pain.  The onset was 14  hours ago.  The course/duration of symptoms is constant.  Location: Left anterior chest. The character of symptoms is heaviness, tightness and achy, not tearing and not pleuritic.  The degree at onset was moderate.  The degree at maximum was moderate.  The degree at present is moderate.  Exacerbating factors:  not exertion, breathing or palpation.  The relieving factor is none.  Associated symptoms: denies shortness of breath, denies nausea, denies vomiting, denies diaphoresis and denies palpitations.        Review of Systems   Constitutional symptoms:  No fever, no chills.    Skin symptoms:  No rash,    Respiratory symptoms:  No shortness of breath, no stridor, no wheezing.     Cardiovascular symptoms:  Chest pain, left chest, anterior, inferior.    Gastrointestinal symptoms:  No abdominal pain, no nausea, no vomiting.    Musculoskeletal symptoms:  No back pain,    Neurologic symptoms:  No headache, no altered level of consciousness, no numbness, no tingling, no focal weakness.              Additional review of systems information: All other systems reviewed and otherwise negative.      Health Status   Allergies:    Allergic Reactions (Selected)  No Known Medication Allergies.   Medications:  (Selected)   Inpatient Medications  Ordered  Nitro-Bid 2% transdermal ointment: 1 in, Topical, Once  aspirin: 324 mg, 4 tabs, Chewed, Once  Documented Medications  Documented  BACID CAPSULES: 0 Refill(s)  GenTeal Tears Moderate ophthalmic solution: 0 Refill(s)  Misc Medication: "rissa- BID 1cap daily, 0 Refill(s)  Misc Medication: tiadyit ER '180mg'$  cap 1 tab daily, 0 Refill(s)  Mucinex 600 mg oral tablet, extended release: mg, tabs, Oral, q12hr, 0 Refill(s)  Pacerone 200 mg oral tablet: 200 mg, 1 tabs,  Oral, Daily, 30 tabs, 0 Refill(s)  alfuzosin 10 mg oral tablet, extended release: 10 mg, 1 tabs, Oral, Daily, 30 tabs, 0 Refill(s)  melatonin 5 mg oral capsule: mg, caps, Oral, Once a Day (at bedtime), 0 Refill(s)  sertraline 25 mg oral tablet: 25 mg, 1 tabs, Oral, Daily, 30 tabs, 0 Refill(s).      Past Medical/ Family/ Social History   Medical history: Reviewed as documented in chart.   Surgical history: Reviewed as documented in chart.   Family history: Not significant.   Social history: Reviewed as documented in chart.   Problem list:    Active Problems (3)  Abnormal heart rate   Hemorrhage   HTN (hypertension)   .      Physical Examination               Vital Signs   Vital Signs   01/17/2021 16:53 EST Temperature Oral 36.4 degC    Heart Rate Monitored 75 bpm    Respiratory Rate 18 br/min    SpO2 98 %   .   Measurements   01/17/2021 16:59 EST Body Mass Index est meas 29.64 kg/m2    Body Mass Index  Measured 29.64 kg/m2   01/17/2021 16:53 EST Height/Length Measured 173 cm    Weight Dosing 88.7 kg   .   Basic Oxygen Information   01/17/2021 16:53 EST Oxygen Therapy Room air    SpO2 98 %   .   General:  Alert, no acute distress.    Skin:  Warm, dry, pink.    Head:  Normocephalic.   Neck:  Supple, trachea midline, no tenderness, no JVD.    Eye:  Pupils are equal, round and reactive to light, extraocular movements are intact.    Ears, nose, mouth and throat:  Oral mucosa moist.   Cardiovascular:  Regular rate and rhythm, No murmur, Normal peripheral perfusion, No edema.    Respiratory:  Lungs are clear to auscultation, respirations are non-labored, breath sounds are equal, Symmetrical chest wall expansion.    Chest wall:  No tenderness, No deformity.    Back:  Normal range of motion.   Musculoskeletal:  Normal ROM, no tenderness.    Gastrointestinal:  Soft, Nontender, Non distended, Normal bowel sounds.    Neurological:  Alert and oriented to person, place, time, and situation, No focal neurological deficit observed, normal sensory observed, normal motor observed.    Lymphatics:  No lymphadenopathy.   Psychiatric:  Cooperative, appropriate mood & affect.       Medical Decision Making   Rationale:  Patient presents with left anterior chest wall pain.  There is no zoster rash, no history of trauma.  X-ray shows no acute pneumothorax or consolidation..   Electrocardiogram:  Emergency Provider interpretation performed by me, Paced rhythm.  No STEMI.Marland Kitchen    Results review:  Lab results : Lab View   01/17/2021 17:46 EST Estimated Creatinine Clearance 54.43 mL/min   01/17/2021 17:15 EST WBC 4.9 x10e3/mcL    RBC 4.23 x10e6/mcL    Hgb 12.6 g/dL  LOW    HCT 38.5 %    MCV 91.0 fL    MCH 29.8 pg    MCHC 32.7 g/dL    RDW 12.9 %    Platelet 184 x10e3/mcL    MPV 10.8 fL    Neutro Auto 73.2 %    Neutro Absolute 3.6 x10e3/mcL    Immature Grans Percent 0.2 %    Immature Grans Absolute 0.01 x10e3/mcL  Lymph Auto 20.9 %    Lymph Absolute  1.0 x10e3/mcL    Mono Auto 5.1 %    Mono Absolute 0.3 x10e3/mcL    Eosinophil Percent 0.4 %    Eos Absolute 0.0 x10e3/mcL    Basophil Auto 0.2 %    Baso Absolute 0.0 x10e3/mcL    D Dimer 0.60 mcg/mL FEU  HI    Sodium Lvl 135 mmol/L    Potassium Lvl 4.3 mmol/L    Chloride 100 mmol/L    CO2 25 mmol/L    Glucose Random 113 mg/dL  HI    BUN 20 mg/dL    Creatinine Lvl 0.9 mg/dL    AGAP 10 mmol/L    Osmolality Calc 273 mOsm/kg    Calcium Lvl 9.2 mg/dL    Protein Total 6.4 g/dL    Albumin Lvl 3.6 g/dL    Globulin Calc 3.0 g/dL    AG Ratio Calc 1.00 mmol/L    Alk Phos 125 unit/L    AST 17 unit/L    ALT 8 unit/L    eGFR AA 84 mL/min/1.42m???  LOW    eGFR Non-AA 73 mL/min/1.755m??  LOW    Bili Total 0.28 mg/dL    Lipase Lvl 10 unit/L  LOW    Lactic Acid Lvl 0.5 mmol/L    Trop T Quant <0.010 ng/mL    SARS-CoV-2 Only ([place]) Detected   01/17/2021 16:59 EST Estimated Creatinine Clearance 48.98 mL/min   .   Radiology results:  Rad Results (ST)   XR Chest 1 View Portable  ?  01/17/21 17:44:24  Portable AP chest: 01/17/21    Comparison: None    Indication: Other abnormalities of breathing    FINDINGS: Dual-lead pacer with leads at the right atrium and right ventricle.  Bilateral interstitial opacities. No pneumothorax. Cardiomediastinal contour is  unremarkable.    IMPRESSION:  Bilateral opacities may indicate edema or infection.    If you have questions regarding this report, please feel free to call me  directly using [employer].  ?  Signed By: BEClent Ridges.      Reexamination/ Reevaluation   Vital signs   Basic Oxygen Information   01/17/2021 16:53 EST Oxygen Therapy Room air    SpO2 98 %      His age-adjusted D-dimer is normal, he has normal troponin in the setting of chest pain and it has been ongoing for greater than 6 hours.  His pulse exam is normal, low suspicion for dissection, he does have COVID, and no JVD or swelling in his legs or orthopnea or findings suggestive of acute heart failure.  His x-ray is nonspecific  this is likely due to COVID illness.  He is not hypoxic or laboring to breathe and his symptoms have been well controlled here.  Will DC home with close PCP follow-up.  Return for any worsening symptoms or concerns.      Impression and Plan   Diagnosis   COVID-19 (ICD10-CM U07.1, Discharge, Medical)   Chest pain in adult (ICD10-CM R07.9, Discharge, Medical)   Pacemaker (ICD10-CM Z95.0, Discharge, Medical)   Atypical chest pain (ICD10-CM R07.89, Discharge, Medical)   Plan   Condition: Improved, Stable.    Disposition: Medically cleared, Discharged: to home.    Patient was given the following educational materials: Chest Pain, Noncardiac.    Follow up with: Follow up with primary care provider Within 2 to 4 days Call to verify appointment location.  Return to ED if symptoms worsen; Your Cardiologist Within 2  to 4 days Call to verify appointment location.  Return to ED if symptoms worsen.    Signature Line     Electronically Signed on 01/18/2021 02:02 AM EST   ________________________________________________   Darlina Rumpf D-MD               Modified by: Darlina Rumpf D-MD on 01/17/2021 07:10 PM EST      Modified by: Darlina Rumpf D-MD on 01/18/2021 01:07 AM EST      Modified by: Darlina Rumpf D-MD on 01/18/2021 01:29 AM EST      Modified by: Darlina Rumpf D-MD on 01/18/2021 02:02 AM EST

## 2021-01-17 NOTE — ED Notes (Signed)
 ED Triage Note       ED Triage Adult Entered On:  01/17/2021 16:59 EST    Performed On:  01/17/2021 16:53 EST by Trudy Planas E-RN               Triage   Numeric Rating Pain Scale :   0 = No pain   Chief Complaint :   pt arrived via EMS for L side chest awareness since 0200 today. denies SHOB, nausea, or diaphoresis. pt [facility].    [place] Mode of Arrival :   Ambulance   Infectious Disease Documentation :   Document assessment   Temperature Oral :   36.4 degC(Converted to: 97.5 degF)    Heart Rate Monitored :   75 bpm   Respiratory Rate :   18 br/min   SpO2 :   98 %   Oxygen Therapy :   Room air   Patient presentation :   None of the above   Chief Complaint or Presentation suggest infection :   No   Dosing Weight Obtained By :   Estimated   Weight Dosing :   88.7 kg(Converted to: 195 lb 9 oz)    Height :   173 cm(Converted to: 5 ft 8 in)    Body Mass Index Dosing :   30 kg/m2   Trudy Planas E-RN - 01/17/2021 16:53 EST   DCP GENERIC CODE   Tracking Acuity :   3   Tracking Group :   ED [facility]   Trudy Planas E-RN - 01/17/2021 16:53 EST   ED General Section :   Document assessment   Pregnancy Status :   N/A   ED Allergies Section :   Document assessment   ED Reason for Visit Section :   Document assessment   Trudy Planas E-RN - 01/17/2021 16:53 EST   PTA/Triage Treatments   ED PTA Pre-Arrival Service :   [facility]   ED PTA Medic Procedures :   IV initiated, EKG-other   Trudy Planas E-RN - 01/17/2021 16:53 EST   ID Risk Screen Symptoms   Recent Travel History :   No recent travel   Close Contact with COVID-19 ID :   No   Last 14 days COVID-19 ID :   No   TB Symptom Screen :   No symptoms   C. diff Symptom/History ID :   Neither of the above   Trudy Planas E-RN - 01/17/2021 16:53 EST   Allergies   (As Of: 01/17/2021 16:59:12 EST)   Allergies (Active)   No Known Medication Allergies  Estimated Onset Date:   Unspecified ; Created By:   Timlin, RN, Tiffany A;  Reaction Status:   Active ; Category:   Drug ; Substance:   No Known Medication Allergies ; Type:   Allergy ; Updated By:   Timlin, RN, Tiffany A; Reviewed Date:   12/03/2020 11:29 EST        Psycho-Social   Last 3 mo, thoughts killing self/others :   Patient denies   Right click within box for Suspected Abuse policy link. :   None   Feels Safe Where Live :   Yes   Trudy Planas E-RN - 01/17/2021 16:53 EST   ED Reason for Visit   (As Of: 01/17/2021 16:59:12 EST)   Diagnoses(Active)    Chest pressure  Date:   01/17/2021 ; Diagnosis Type:  Reason For Visit ; Confirmation:   Complaint of ; Clinical Dx:   Chest pressure ; Classification:   Medical ; Clinical Service:   Emergency medicine ; Code:   PNED ; Probability:   0 ; Diagnosis Code:   [id number]

## 2021-01-17 NOTE — ED Notes (Signed)
 ED Patient Summary                 [facility]  [address] [place], [place] [address]  [phone]  Discharge Instructions (Patient)  Darren Anderson, Darren Anderson  DOB:  11/20/1925                   MRN: [id number]                   FIN: [id number]  Reason For Visit: Chest pressure; CHEST AWARNESS,EMS  Final Diagnosis: Atypical chest pain; COVID-19; Chest pain in adult; Pacemaker     Visit Date: 01/17/2021 16:50:00  Address: [address] [place] [facility] [address]  Phone: [phone]     Emergency Department Providers:         Primary Physician:      THEOTIS SAVANT D-MD      [facility] would like to thank you for allowing us  to assist you with your healthcare needs. The following includes patient education materials and information regarding your injury/illness.     Follow-up Instructions:  You were seen today on an emergency basis. Please contact your primary care doctor for a follow up appointment. If you received a referral to a specialist doctor, it is important you follow-up as instructed.    It is important that you call your follow-up doctor to schedule and confirm the location of your next appointment. Your doctor may practice at multiple locations. The office location of your follow-up appointment may be different to the one written on your discharge instructions.    If you do not have a primary care doctor, please call [phone] for help in finding a [name] Provider. For help in finding a specialist doctor, please call [phone].    If your condition gets worse before your follow-up with your primary care doctor or specialist, please return to the Emergency Department.      Coronavirus 2019 (COVID-19) Reminders:     Patients age 85 - 48, with parental consent, and over age 85 can make an appointment for a COVID-19 vaccine. Patients can contact their [name] doctors' offices to schedule an appointment to  receive the COVID-19 vaccine. Patients who do not have a [name] physician can call [phone] to schedule vaccination appointments.      Follow Up Appointments:  Primary Care Provider:     Name: DEBBY SENIOR [facility]     Phone: [phone]                 With: Address: When:   Your Cardiologist  Within 2 to 4 days   Comments:   Call to verify appointment location.   Return to ED if symptoms worsen       With: Address: When:   Follow up with primary care provider  Within 2 to 4 days   Comments:   Call to verify appointment location.   Return to ED if symptoms worsen                   Medications That Were Updated - Follow Current Instructions  Other Medications  Current Misc Medication rissa- BID 1cap daily.  Last Dose:____________________  Current Misc Medication tiadyit ER 180mg  cap 1 tab daily.  Last Dose:____________________  Current Misc Medication (BACID CAPSULES)   Last Dose:____________________    Medications that have not changed  Other Medications  alfuzosin (alfuzosin 10  mg oral tablet, extended release) 1 Tabs Oral (given by mouth) every day.  Last Dose:____________________  amiodarone (Pacerone 200 mg oral tablet) 1 Tabs Oral (given by mouth) every day.  Last Dose:____________________  guaiFENesin (Mucinex 600 mg oral tablet, extended release) Oral (given by mouth) every 12 hours.  Last Dose:____________________  melatonin (melatonin 5 mg oral capsule) Oral (given by mouth) Once a Day (at bedtime).  Last Dose:____________________  ocular lubricant (GenTeal Tears Moderate ophthalmic solution)   Last Dose:____________________  sertraline (sertraline 25 mg oral tablet) 1 Tabs Oral (given by mouth) every day., SOUND ALIKE / LOOK ALIKE - VERIFY DRUG  Last Dose:____________________      Allergy Info: No Known Medication Allergies     Discharge Additional Information          Discharge Patient 01/17/21 19:06:00 EST      Patient Education Materials:               Noncardiac Chest Pain            Based on your visit today, the healthcare provider doesn?t know what is causing your chest pain. In most cases, people who come to the emergency room with chest pain don?t have a problem with their heart. Instead, the pain is caused by other conditions. It's important for the healthcare team to be sure you are not having a life-threatening cause for chest pain such as:      Heart attack     Blood clot in the lungs     Collapsed lung     Ruptured esophagus     Tearing of the aorta    Once these major causes have been ruled out, you may have further evaluation for nonheart causes of chest pain. These may be problems with the lungs, muscles, bones, digestive tract, nerves, or mental health. They include:      Inflammation around the lungs (pleurisy)      Collapsed lung (pneumothorax)      Fluid around the lungs (pleural effusion)      Lung cancer (a rare cause of chest pain)      Inflamed cartilage between the ribs (costochondritis)      Fibromyalgia     Rheumatoid arthritis     Chest wall strain     Reflux     Stomach ulcer     Spasms of the esophagus     Gall stones     Gallbladder inflammation     Panic or anxiety attacks     Emotional distress    Your condition doesn?t seem serious. And your pain doesn?t seem to be coming from your heart. But sometimes the signs of a serious problem take more time to appear. Watch for the warning signs listed below.    Home care   Follow these guidelines when caring for yourself at home:      Rest today and don't do any strenuous activity.      Take any prescribed medicine as directed.     Follow-up care   Follow up with your healthcare provider, or as advised, if you don?t start to feel better in 24 hours.    Call 911   Call 911 if any of these occur:      A change in the type of pain: if it feels different, becomes more severe, lasts longer, or begins to spread into your shoulder, arm, neck, jaw or back      Shortness of breath or increased pain with  breathing     Weakness, dizziness, or fainting     Rapid heart beat     Crushing sensation in your chest    When to seek medical advice   Call your healthcare provider right away if any of these occur:      Cough with dark colored sputum (phlegm) or blood     Fever of 100.48F (38C) or higher, or as directed by your healthcare provider     Swelling, pain or redness in one leg        2000-2021 [employer], [place]. All rights reserved. This information is not intended as a substitute for professional medical care. Always follow your healthcare professional's instructions.     ---------------------------------------------------------------------------------------------------------------------  [name]) encourages you to self-enroll in the [facility] Patient Portal.  [facility] Patient Portal will allow you to manage your personal health information securely from your own electronic device now and in the future.  To begin your Patient Portal enrollment process, please visit [email or web address] Click on "Sign up now" under [facility].  If you find that you need additional assistance on the [facility] Patient Portal or need a copy of your medical records, please call the [facility] Medical Records Office at [phone].  Comment:

## 2021-01-17 NOTE — ED Notes (Signed)
ED Note-Nursing               Called the [facility] facility to update on status of Pt. Spoke with [place], and advised that pt does have covid, asked pt's son to transport back to [place] but son feels he is unable to physically transport his father.    LL wheelchair transport requested to [place] due to family's concern for falls during transport.             [employer]

## 2021-01-17 NOTE — ED Notes (Signed)
 ED Triage Note       ED Secondary Triage Entered On:  01/17/2021 17:07 EST    Performed On:  01/17/2021 16:59 EST by Trudy Planas E-RN               General Information   Barriers to Learning :   Hearing deficit   Languages :   English   ED Home Meds Section :   Document assessment   [facility] ED Fall Risk Section :   Document assessment   ED Advance Directives Section :   Document assessment   ED Palliative Screen :   Document assessment   Trudy Planas E-RN - 01/17/2021 16:59 EST   (As Of: 01/17/2021 17:07:24 EST)   Problems(Active)    Abnormal heart rate (SNOMED CT  :522584981 )  Name of Problem:   Abnormal heart rate ; Recorder:   Trudy Planas E-RN; Confirmation:   Confirmed ; Classification:   Patient Stated ; Code:   [id number] ; Contributor System:   PowerChart ; Last Updated:   01/17/2021 17:07 EST ; Life Cycle Date:   01/17/2021 ; Life Cycle Status:   Active ; Vocabulary:   SNOMED CT        Hemorrhage (SNOMED CT  :[id number] )  Name of Problem:   Hemorrhage ; Recorder:   Trudy Planas MURRAY; Confirmation:   Confirmed ; Classification:   Patient Stated ; Code:   [id number] ; Contributor System:   PowerChart ; Last Updated:   01/17/2021 17:07 EST ; Life Cycle Date:   01/17/2021 ; Life Cycle Status:   Active ; Vocabulary:   SNOMED CT        HTN (hypertension) (SNOMED CT  :8784255987 )  Name of Problem:   HTN (hypertension) ; Recorder:   Trudy Planas E-RN; Confirmation:   Confirmed ; Classification:   Patient Stated ; Code:   [id number] ; Contributor System:   PowerChart ; Last Updated:   01/17/2021 17:07 EST ; Life Cycle Date:   01/17/2021 ; Life Cycle Status:   Active ; Vocabulary:   SNOMED CT          Diagnoses(Active)    Chest pressure  Date:   01/17/2021 ; Diagnosis Type:   Reason For Visit ; Confirmation:   Complaint of ; Clinical Dx:   Chest pressure ; Classification:   Medical ; Clinical Service:   Emergency medicine ; Code:   PNED ; Probability:   0 ; Diagnosis Code:    [id number]             -    Procedure History   (As Of: 01/17/2021 17:07:24 EST)     [place] Fall Risk Assessment Tool   Hx of falling last 3 months ED Fall :   No   Patient confused or disoriented ED Fall :   Unable to assess   Patient intoxicated or sedated ED Fall :   No   Patient impaired gait ED Fall :   No   Use a mobility assistance device ED Fall :   No   Patient altered elimination ED Fall :   No   [facility] ED Fall Score :   6    Trudy Planas E-RN - 01/17/2021 16:59 EST   ED Advance Directive   Advance Directive :   No   Trudy Planas E-RN - 01/17/2021 16:59 EST   Palliative Care   Does the Patient have a Life Limiting Illness :  Unable to determine   Trudy Planas E-RN - 01/17/2021 16:59 EST   Med Hx   Medication List   (As Of: 01/17/2021 17:07:24 EST)   Home Meds    Misc Medication  :   Misc Medication ; Status:   Documented ; Ordered As Mnemonic:   Misc Medication ; Simple Display Line:   tiadyit ER 180mg  cap 1 tab daily, 0 Refill(s) ; Catalog Code:   Misc Medication ; Order Dt/Tm:   01/17/2021 17:04:31 EST          Misc Medication  :   Misc Medication ; Status:   Documented ; Ordered As Mnemonic:   Misc Medication ; Simple Display Line:   rissa- BID 1cap daily, 0 Refill(s) ; Catalog Code:   Misc Medication ; Order Dt/Tm:   01/17/2021 17:04:42 EST          guaiFENesin  :   guaiFENesin ; Status:   Documented ; Ordered As Mnemonic:   Mucinex 600 mg oral tablet, extended release ; Simple Display Line:   mg, tabs, Oral, q12hr, 0 Refill(s) ; Catalog Code:   guaiFENesin ; Order Dt/Tm:   01/17/2021 17:03:24 EST          melatonin  :   melatonin ; Status:   Documented ; Ordered As Mnemonic:   melatonin 5 mg oral capsule ; Simple Display Line:   mg, caps, Oral, Once a Day (at bedtime), 0 Refill(s) ; Catalog Code:   melatonin ; Order Dt/Tm:   01/17/2021 17:02:38 EST          Misc Medication  :   Misc Medication ; Status:   Documented ; Ordered As Mnemonic:   BACID CAPSULES ; Simple  Display Line:   0 Refill(s) ; Catalog Code:   Misc Medication ; Order Dt/Tm:   12/03/2020 11:15:29 EST          Misc Medication  :   Misc Medication ; Status:   Completed ; Ordered As Mnemonic:   DILTIAZEM CD 180MG  CAPSULES (24 HR) ; Simple Display Line:   0 Refill(s) ; Catalog Code:   Misc Medication ; Order Dt/Tm:   12/03/2020 11:15:29 EST          ocular lubricant  :   ocular lubricant ; Status:   Documented ; Ordered As Mnemonic:   GenTeal Tears Moderate ophthalmic solution ; Simple Display Line:   0 Refill(s) ; Catalog Code:   ocular lubricant ; Order Dt/Tm:   12/03/2020 11:15:29 EST          amiodarone  :   amiodarone ; Status:   Documented ; Ordered As Mnemonic:   Pacerone 200 mg oral tablet ; Simple Display Line:   200 mg, 1 tabs, Oral, Daily, 30 tabs, 0 Refill(s) ; Catalog Code:   amiodarone ; Order Dt/Tm:   12/03/2020 11:15:29 EST          Misc Medication  :   Misc Medication ; Status:   Completed ; Ordered As Mnemonic:   DILTIAZEM 24H ER(CD) 180 MG CP ; Simple Display Line:   0 Refill(s) ; Catalog Code:   Misc Medication ; Order Dt/Tm:   12/03/2020 11:15:29 EST          sertraline  :   sertraline ; Status:   Documented ; Ordered As Mnemonic:   sertraline 25 mg oral tablet ; Simple Display Line:   25 mg, 1 tabs, Oral, Daily, 30 tabs, 0 Refill(s) ; Catalog Code:  sertraline ; Order Dt/Tm:   12/03/2020 11:15:29 EST ; Comment:   SOUND ALIKE / LOOK ALIKE - VERIFY DRUG          alfuzosin  :   alfuzosin ; Status:   Documented ; Ordered As Mnemonic:   alfuzosin 10 mg oral tablet, extended release ; Simple Display Line:   10 mg, 1 tabs, Oral, Daily, 30 tabs, 0 Refill(s) ; Catalog Code:   alfuzosin ; Order Dt/Tm:   12/03/2020 11:15:29 EST

## 2021-01-17 NOTE — ED Notes (Signed)
 ED Patient Education Note     Patient Education Materials Follows:              Noncardiac Chest Pain           Based on your visit today, the healthcare provider doesn?t know what is causing your chest pain. In most cases, people who come to the emergency room with chest pain don?t have a problem with their heart. Instead, the pain is caused by other conditions. It's important for the healthcare team to be sure you are not having a life-threatening cause for chest pain such as:      Heart attack     Blood clot in the lungs     Collapsed lung     Ruptured esophagus     Tearing of the aorta    Once these major causes have been ruled out, you may have further evaluation for nonheart causes of chest pain. These may be problems with the lungs, muscles, bones, digestive tract, nerves, or mental health. They include:      Inflammation around the lungs (pleurisy)      Collapsed lung (pneumothorax)      Fluid around the lungs (pleural effusion)      Lung cancer (a rare cause of chest pain)      Inflamed cartilage between the ribs (costochondritis)      Fibromyalgia     Rheumatoid arthritis     Chest wall strain     Reflux     Stomach ulcer     Spasms of the esophagus     Gall stones     Gallbladder inflammation     Panic or anxiety attacks     Emotional distress    Your condition doesn?t seem serious. And your pain doesn?t seem to be coming from your heart. But sometimes the signs of a serious problem take more time to appear. Watch for the warning signs listed below.    Home care   Follow these guidelines when caring for yourself at home:      Rest today and don't do any strenuous activity.      Take any prescribed medicine as directed.     Follow-up care   Follow up with your healthcare provider, or as advised, if you don?t start to feel better in 24 hours.    Call 911   Call 911 if any of these occur:      A change in the type of pain: if it feels different, becomes more severe, lasts longer, or  begins to spread into your shoulder, arm, neck, jaw or back      Shortness of breath or increased pain with breathing     Weakness, dizziness, or fainting     Rapid heart beat     Crushing sensation in your chest    When to seek medical advice   Call your healthcare provider right away if any of these occur:      Cough with dark colored sputum (phlegm) or blood     Fever of 100.5F (38C) or higher, or as directed by your healthcare provider     Swelling, pain or redness in one leg        2000-2021 [employer], [place]. All rights reserved. This information is not intended as a substitute for professional medical care. Always follow your healthcare professional's instructions.

## 2021-01-17 NOTE — Discharge Summary (Signed)
 ED Clinical Summary                         [facility]  [address]  [place], [place], [address]  [phone]           PERSON INFORMATION  Name: Darren Anderson, Darren Anderson Age:  87 Years DOB: December 11, 1925   Sex: Male Language: English PCP: DEBBY SENIOR MARK-MD   Marital Status:  Unknown Phone: [phone] Med Service: MED-Medicine   MRN:  [id number] Acct# [id number] Arrival: 01/17/2021 16:50:00   Visit Reason: Chest pressure; CHEST AWARNESS,EMS Acuity: 3 LOS: 000 03:57   Address:      [address] [place] [address]  Diagnosis:      Atypical chest pain; COVID-19; Chest pain in adult; Pacemaker  Printed Prescriptions:            Allergies      No Known Medication Allergies      Medications Administered During Visit:                  Medication Dose Route   nitroglycerin 1 in Topical   aspirin 324 mg Chewed       Patient Medication List:              alfuzosin (alfuzosin 10 mg oral tablet, extended release) 1 Tabs Oral (given by mouth) every day.  amiodarone (Pacerone 200 mg oral tablet) 1 Tabs Oral (given by mouth) every day.  guaiFENesin (Mucinex 600 mg oral tablet, extended release) Oral (given by mouth) every 12 hours.  melatonin (melatonin 5 mg oral capsule) Oral (given by mouth) Once a Day (at bedtime).  Misc Medication rissa- BID 1cap daily.  Misc Medication tiadyit ER 180mg  cap 1 tab daily.  Misc Medication (BACID CAPSULES)  ocular lubricant (GenTeal Tears Moderate ophthalmic solution)  sertraline (sertraline 25 mg oral tablet) 1 Tabs Oral (given by mouth) every day., SOUND ALIKE / LOOK ALIKE - VERIFY DRUG         Major Tests and Procedures:  The following procedures and tests were performed during your ED visit.  COMMONPROCEDURES%>  COMMON PROCEDURESCOMMENTS%>          Laboratory Orders  Name Status Details   CBCDIFF Completed Blood, Stat, ST - Stat, 01/17/21 17:12:00 EST, 01/17/21 17:12:00 EST, Nurse collect, THEOTIS SAVANT D-MD, Print label Y/N   CMP Completed  Blood, Stat, ST - Stat, 01/17/21 17:12:00 EST, 01/17/21 17:12:00 EST, Nurse collect, THEOTIS SAVANT D-MD, Print label Y/N   [facility] Completed Nasopharyngeal Swab, Stat, ST - Stat, 01/17/21 17:12:00 EST, 01/17/21 17:12:00 EST, Nurse collect, THEOTIS SAVANT D-MD, Print label Y/N   D [facility]imer Completed Blood, Stat, ST - Stat, 01/17/21 17:12:00 EST, 01/17/21 17:12:00 EST, Nurse collect, THEOTIS SAVANT D-MD, Print label Y/N   Lactic Completed Blood, Stat, ST - Stat, 01/17/21 17:12:00 EST, 01/17/21 17:12:00 EST, Nurse collect, THEOTIS SAVANT D-MD, Print label Y/N   Lipase Lvl Completed Blood, Stat, ST - Stat, 01/17/21 17:12:00 EST, 01/17/21 17:12:00 EST, Nurse collect, THEOTIS SAVANT D-MD, Print label Y/N   Trop T Completed Blood, Stat, ST - Stat, 01/17/21 17:12:00 EST, 01/17/21 17:12:00 EST, Nurse collect, THEOTIS SAVANT D-MD, Print label Y/N               Radiology Orders  Name Status Details   XR Chest 1 View Portable Completed 01/17/21 17:02:00 EST, STAT 1 hour or less,  Reason: Other abnormalities of breathing, Transport Mode: Portable, pp_[id number]_subspecialty               Patient Care Orders  Name Status Details   COVID-19 Status Ordered 01/17/21 17:12:34 EST, NOT VALID FOR pharmacy, laboratory, radiology., 01/17/21 17:12:34 EST, COVID-19 Detected   Communication to Nursing Completed 01/17/21 17:07:24 EST, Move into view of RN station if possible, 01/17/21 17:07:24 EST   Discharge Patient Ordered 01/17/21 19:06:00 EST   ED Assessment Adult Completed 01/17/21 16:59:13 EST, 01/17/21 16:59:13 EST   ED Secondary Triage Completed 01/17/21 16:59:13 EST, 01/17/21 16:59:13 EST   ED Triage Adult Completed 01/17/21 16:51:05 EST, 01/17/21 16:51:05 EST   Fall Risk Precautions Ordered 01/17/21 17:07:24 EST, 01/17/21 17:07:24 EST   Notify Provider Completed 01/17/21 17:12:34 EST, This message can only be seen by Nursing, it is not visible to Pharmacy, Laboratory, or Radiology., 01/17/21 17:12:34 EST    Patient Isolation Ordered 01/17/21 17:12:00 EST, Contact and Airborne, Constant Indicator   Patient Specific Fall Safety Measures Ordered 01/17/21 23:30:00 EST, ED Mod/High Fall Risk Documentation   Patient Specific Fall Safety Measures Ordered 01/18/21 2:30:00 EST, ED Mod/High Fall Risk Documentation   Patient Specific Fall Safety Measures Ordered 01/18/21 3:30:00 EST, ED Mod/High Fall Risk Documentation   Patient Specific Fall Safety Measures Ordered 01/18/21 4:30:00 EST, ED Mod/High Fall Risk Documentation   Patient Specific Fall Safety Measures Ordered 01/18/21 5:30:00 EST, ED Mod/High Fall Risk Documentation   Patient Specific Fall Safety Measures Ordered 01/18/21 6:30:00 EST, ED Mod/High Fall Risk Documentation   Patient Specific Fall Safety Measures Ordered 01/18/21 7:30:00 EST, ED Mod/High Fall Risk Documentation   Patient Specific Fall Safety Measures Ordered 01/18/21 8:30:00 EST, ED Mod/High Fall Risk Documentation   Patient Specific Fall Safety Measures Ordered 01/17/21 17:07:24 EST, q1hr, ED Mod/High Fall Risk Documentation   Patient Specific Fall Safety Measures Completed 01/17/21 17:07:24 EST, Once, 01/17/21 17:07:24 EST, ED Mod/High Fall Risk Documentation   Patient Specific Fall Safety Measures Completed 01/17/21 17:30:00 EST, ED Mod/High Fall Risk Documentation   Patient Specific Fall Safety Measures Completed 01/17/21 18:30:00 EST, ED Mod/High Fall Risk Documentation   Patient Specific Fall Safety Measures Ordered 01/17/21 19:30:00 EST, ED Mod/High Fall Risk Documentation   Patient Specific Fall Safety Measures Ordered 01/17/21 20:30:00 EST, ED Mod/High Fall Risk Documentation   Patient Specific Fall Safety Measures Ordered 01/18/21 9:30:00 EST, ED Mod/High Fall Risk Documentation   Patient Specific Fall Safety Measures Ordered 01/18/21 10:30:00 EST, ED Mod/High Fall Risk Documentation   Patient Specific Fall Safety Measures Ordered 01/18/21 11:30:00 EST, ED Mod/High Fall Risk Documentation    Patient Specific Fall Safety Measures Ordered 01/18/21 12:30:00 EST, ED Mod/High Fall Risk Documentation   Patient Specific Fall Safety Measures Ordered 01/18/21 13:30:00 EST, ED Mod/High Fall Risk Documentation   Patient Specific Fall Safety Measures Ordered 01/18/21 14:30:00 EST, ED Mod/High Fall Risk Documentation   Patient Specific Fall Safety Measures Ordered 01/18/21 15:30:00 EST, ED Mod/High Fall Risk Documentation   Patient Specific Fall Safety Measures Ordered 01/18/21 16:30:00 EST, ED Mod/High Fall Risk Documentation   Patient Specific Fall Safety Measures Ordered 01/18/21 17:30:00 EST, ED Mod/High Fall Risk Documentation   Patient Specific Fall Safety Measures Ordered 01/18/21 18:30:00 EST, ED Mod/High Fall Risk Documentation   Patient Specific Fall Safety Measures Ordered 01/18/21 19:30:00 EST, ED Mod/High Fall Risk Documentation   Patient Specific Fall Safety Measures Ordered 01/18/21 20:30:00 EST, ED Mod/High Fall Risk Documentation   Patient Specific Fall Safety Measures Ordered 01/18/21 21:30:00 EST,  ED Mod/High Fall Risk Documentation   Patient Specific Fall Safety Measures Ordered 01/18/21 22:30:00 EST, ED Mod/High Fall Risk Documentation   Patient Specific Fall Safety Measures Ordered 01/17/21 22:30:00 EST, ED Mod/High Fall Risk Documentation   Patient Specific Fall Safety Measures Ordered 01/17/21 21:30:00 EST, ED Mod/High Fall Risk Documentation   Patient Specific Fall Safety Measures Ordered 01/18/21 0:30:00 EST, ED Mod/High Fall Risk Documentation   Patient Specific Fall Safety Measures Ordered 01/18/21 1:30:00 EST, ED Mod/High Fall Risk Documentation             PROVIDER INFORMATION               Provider Role Assigned Sampson SERVANT, JAYSON D-MD ED Provider 01/17/2021 16:53:39    Trudy Planas E-RN ED Nurse 01/17/2021 17:07:36        Attending Physician:  SERVANT JAYSON D-MD     Admit Doc  SERVANT JAYSON D-MD     Consulting Doc       VITALS INFORMATION  Vital Sign Triage Latest    Temp Oral ORAL_1%>36.4 degC ORAL%>36.4 degC   Temp Temporal TEMPORAL_1%> TEMPORAL%>   Temp Intravascular INTRAVASCULAR_1%> INTRAVASCULAR%>   Temp Axillary AXILLARY_1%> AXILLARY%>   Temp Rectal RECTAL_1%> RECTAL%>   02 Sat 98 % 96 %   Respiratory Rate RATE_1%>18 br/min RATE%>14 br/min   Peripheral Pulse Rate PULSE RATE_1%>65 bpm PULSE RATE%>70 bpm   Apical Heart Rate HEART RATE_1%> HEART RATE%>   Blood Pressure BLOOD PRESSURE_1%>/ BLOOD PRESSURE_1%>73 mmHg BLOOD PRESSURE%>133 mmHg / BLOOD PRESSURE%>76 mmHg                 Immunizations      No Immunizations Documented This Visit          DISCHARGE INFORMATION   Discharge Disposition: H Outpt-Sent Home   Discharge Location:    Home   Discharge Date and Time:    01/17/2021 20:47:40   ED Checkout Date and Time:    01/17/2021 20:47:40     DEPART REASON INCOMPLETE INFORMATION               Depart Action Incomplete Reason   Interactive View/I&O Recently assessed               Problems      No Problems Documented              Smoking Status      No Smoking Status Documented         PATIENT EDUCATION INFORMATION  Instructions:       Chest Pain, Noncardiac     Follow up:                    With: Address: When:   Your Cardiologist  Within 2 to 4 days   Comments:   Call to verify appointment location.   Return to ED if symptoms worsen       With: Address: When:   Follow up with primary care provider  Within 2 to 4 days   Comments:   Call to verify appointment location.   Return to ED if symptoms worsen           ED PROVIDER DOCUMENTATION

## 2021-01-17 NOTE — ED Notes (Signed)
ED Pre-Arrival Note        Pre-Arrival Summary    Name:  [facility],    Current Date:  01/17/2021 16:53:29 EST  Gender:  Male  Date of Birth:    Age:  87  Pre-Arrival Type:  EMS  ETA:  01/17/2021 17:12:00 EST  Primary Care Physician:    Presenting Problem:  Chest awarness  Pre-Arrival User:    Referring Source:    Location:  [place]  BP:  142/71  HR:  70  O2:  99            PreArrival Communication Form  Emergency Department        Additional Patient Information:        Orders:  [    ] CBC                                            [     ] CT Head no contrast  [    ] BMP                                           [     ] CT Abdomen/Pelvis no contrast  [    ] PT/INR                                       [     ] CT Abdomen/Pelvis IV contrast, w/ oral contrast  [    ] Troponin                                   [     ] CT Abdomen/Pelvis IV contrast, no oral contrast  [    ] BNP                                            [     ] See ordersheet  [    ] CXR                                             [     ] Other:__________________________  [    ] EKG
